# Patient Record
Sex: Male | Born: 1940 | Race: White | Hispanic: No | Marital: Single | Smoking: Former smoker
Health system: Southern US, Community
[De-identification: ages and names within clinical notes are randomized; demographics above are authoritative.]

## PROBLEM LIST (undated history)

## (undated) DIAGNOSIS — N400 Enlarged prostate without lower urinary tract symptoms: Secondary | ICD-10-CM

## (undated) DIAGNOSIS — E785 Hyperlipidemia, unspecified: Secondary | ICD-10-CM

## (undated) DIAGNOSIS — M1990 Unspecified osteoarthritis, unspecified site: Secondary | ICD-10-CM

## (undated) DIAGNOSIS — E1140 Type 2 diabetes mellitus with diabetic neuropathy, unspecified: Secondary | ICD-10-CM

## (undated) DIAGNOSIS — Z85528 Personal history of other malignant neoplasm of kidney: Secondary | ICD-10-CM

## (undated) DIAGNOSIS — N189 Chronic kidney disease, unspecified: Secondary | ICD-10-CM

## (undated) DIAGNOSIS — E119 Type 2 diabetes mellitus without complications: Secondary | ICD-10-CM

## (undated) DIAGNOSIS — I10 Essential (primary) hypertension: Secondary | ICD-10-CM

## (undated) HISTORY — DX: Personal history of other malignant neoplasm of kidney: Z85.528

## (undated) HISTORY — DX: Chronic kidney disease, unspecified: N18.9

## (undated) HISTORY — DX: Type 2 diabetes mellitus without complications: E11.9

## (undated) HISTORY — DX: Hyperlipidemia, unspecified: E78.5

## (undated) HISTORY — PX: CARPAL TUNNEL RELEASE: SHX101

## (undated) HISTORY — DX: Benign prostatic hyperplasia without lower urinary tract symptoms: N40.0

## (undated) HISTORY — DX: Type 2 diabetes mellitus with diabetic neuropathy, unspecified: E11.40

## (undated) HISTORY — PX: NEPHRECTOMY: SHX65

## (undated) HISTORY — PX: CATARACT EXTRACTION: SUR2

## (undated) HISTORY — DX: Unspecified osteoarthritis, unspecified site: M19.90

## (undated) HISTORY — DX: Essential (primary) hypertension: I10

---

## 2015-08-01 ENCOUNTER — Other Ambulatory Visit: Payer: Self-pay

## 2015-08-01 MED ORDER — GLUCOSE BLOOD VI STRP: ORAL_STRIP | Status: DC

## 2015-09-12 LAB — HEMOGLOBIN A1C: HEMOGLOBIN A1C: 8.6 % — AB (ref 4.0–6.0)

## 2015-09-19 ENCOUNTER — Encounter: Payer: Self-pay | Admitting: "Endocrinology

## 2015-09-19 ENCOUNTER — Ambulatory Visit (INDEPENDENT_AMBULATORY_CARE_PROVIDER_SITE_OTHER): Payer: Medicare (Managed Care) | Admitting: "Endocrinology

## 2015-09-19 VITALS — BP 141/62 | HR 82 | Ht 67.0 in | Wt 215.0 lb

## 2015-09-19 DIAGNOSIS — I10 Essential (primary) hypertension: Secondary | ICD-10-CM | POA: Insufficient documentation

## 2015-09-19 DIAGNOSIS — E782 Mixed hyperlipidemia: Secondary | ICD-10-CM | POA: Insufficient documentation

## 2015-09-19 DIAGNOSIS — Z794 Long term (current) use of insulin: Secondary | ICD-10-CM

## 2015-09-19 DIAGNOSIS — E118 Type 2 diabetes mellitus with unspecified complications: Secondary | ICD-10-CM | POA: Diagnosis not present

## 2015-09-19 DIAGNOSIS — IMO0002 Reserved for concepts with insufficient information to code with codable children: Secondary | ICD-10-CM

## 2015-09-19 DIAGNOSIS — E785 Hyperlipidemia, unspecified: Secondary | ICD-10-CM | POA: Diagnosis not present

## 2015-09-19 DIAGNOSIS — E1165 Type 2 diabetes mellitus with hyperglycemia: Secondary | ICD-10-CM | POA: Diagnosis not present

## 2015-09-19 MED ORDER — INSULIN GLARGINE 100 UNIT/ML ~~LOC~~ SOLN: 35.0000 [IU] | Freq: Every day | SUBCUTANEOUS | Status: DC

## 2015-09-19 MED ORDER — INSULIN ASPART 100 UNIT/ML ~~LOC~~ SOLN: 10.0000 [IU] | Freq: Three times a day (TID) | SUBCUTANEOUS | Status: DC

## 2015-09-19 NOTE — Progress Notes (Signed)
Subjective:    Patient ID: Noah Daniel, male    DOB: 07/27/1941, PCP Alinda DeemJANNACH,STEPHEN, MD   Past Medical History  Diagnosis Date  . Hypertension   . Osteoarthritis   . Diabetes mellitus, type II (HCC)   . Diabetic neuropathy (HCC)   . History of kidney cancer   . CKD (chronic kidney disease)   . Hyperlipidemia   . BPH (benign prostatic hyperplasia)    Past Surgical History  Procedure Laterality Date  . Carpal tunnel release    . Cataract extraction    . Nephrectomy     Social History   Social History  . Marital Status: Unknown    Spouse Name: N/A  . Number of Children: N/A  . Years of Education: N/A   Social History Main Topics  . Smoking status: Former Games developermoker  . Smokeless tobacco: None  . Alcohol Use: No  . Drug Use: No  . Sexual Activity: Not Asked   Other Topics Concern  . None   Social History Narrative  . None   Outpatient Encounter Prescriptions as of 09/19/2015  Medication Sig  . amLODipine (NORVASC) 5 MG tablet Take 5 mg by mouth daily.  Marland Kitchen. aspirin 81 MG tablet Take 81 mg by mouth daily.  Marland Kitchen. glucose blood test strip Use as instructed 4 X daily  . losartan-hydrochlorothiazide (HYZAAR) 50-12.5 MG tablet Take 1 tablet by mouth daily.  . pravastatin (PRAVACHOL) 20 MG tablet Take 20 mg by mouth daily.  . Vitamin D, Cholecalciferol, 1000 UNITS CAPS Take by mouth daily.  . [DISCONTINUED] insulin aspart (NOVOLOG FLEXPEN) 100 UNIT/ML FlexPen Inject 10-16 Units into the skin 3 (three) times daily with meals.  . [DISCONTINUED] Insulin Glargine (LANTUS SOLOSTAR) 100 UNIT/ML Solostar Pen Inject 35 Units into the skin at bedtime.  . insulin aspart (NOVOLOG) 100 UNIT/ML injection Inject 10-16 Units into the skin 3 (three) times daily with meals.  . insulin aspart (NOVOLOG) 100 UNIT/ML injection Inject 10-16 Units into the skin 3 (three) times daily with meals.  . insulin glargine (LANTUS) 100 UNIT/ML injection Inject 0.35 mLs (35 Units total) into the skin at  bedtime.  . insulin glargine (LANTUS) 100 UNIT/ML injection Inject 0.35 mLs (35 Units total) into the skin at bedtime.   No facility-administered encounter medications on file as of 09/19/2015.   ALLERGIES: No Known Allergies VACCINATION STATUS:  There is no immunization history on file for this patient.  Diabetes He presents for his follow-up diabetic visit. He has type 2 diabetes mellitus. Onset time: He was diagnosed at approximate age of 38 years. His disease course has been stable. There are no hypoglycemic associated symptoms. Pertinent negatives for hypoglycemia include no confusion, headaches, pallor or seizures. Associated symptoms include fatigue, polydipsia and polyuria. Pertinent negatives for diabetes include no chest pain, no polyphagia and no weakness. There are no hypoglycemic complications. Symptoms are stable. Diabetic complications include peripheral neuropathy. 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 Current diabetic treatment includes intensive insulin program. He is compliant with treatment most of the time. His weight is increasing steadily. He is following a generally unhealthy diet. He has not had a previous visit with a dietitian. He participates in exercise intermittently. His home blood glucose trend is fluctuating minimally. His breakfast blood glucose range is generally 180-200 mg/dl. His lunch blood glucose range is generally 180-200 mg/dl. His dinner blood glucose range is generally  180-200 mg/dl. His overall blood glucose range is 180-200 mg/dl. An ACE inhibitor/angiotensin II receptor blocker is being taken. Eye exam is current.  Hyperlipidemia This is a chronic problem. The current episode started more than 1 year ago. Pertinent negatives include no chest pain, myalgias or shortness of breath. Current  antihyperlipidemic treatment includes statins. Risk factors for coronary artery disease include dyslipidemia, diabetes mellitus, hypertension, male sex and obesity.  Hypertension This is a chronic problem. The current episode started more than 1 year ago. The problem is uncontrolled. Pertinent negatives include no chest pain, headaches, neck pain, palpitations or shortness of breath. Risk factors for coronary artery disease include diabetes mellitus, dyslipidemia, male gender, obesity and smoking/tobacco exposure. Past treatments include angiotensin blockers and diuretics.     Review of Systems  Constitutional: Positive for fatigue. Negative for unexpected weight change.  HENT: Negative for dental problem, mouth sores and trouble swallowing.   Eyes: Negative for visual disturbance.  Respiratory: Negative for cough, choking, chest tightness, shortness of breath and wheezing.   Cardiovascular: Negative for chest pain, palpitations and leg swelling.  Gastrointestinal: Negative for nausea, vomiting, abdominal pain, diarrhea, constipation and abdominal distention.  Endocrine: Positive for polydipsia and polyuria. Negative for polyphagia.  Genitourinary: Negative for dysuria, urgency, hematuria and flank pain.  Musculoskeletal: Negative for myalgias, back pain, gait problem and neck pain.  Skin: Negative for pallor, rash and wound.  Neurological: Negative for seizures, syncope, weakness, numbness and headaches.  Psychiatric/Behavioral: Negative.  Negative for confusion and dysphoric mood.    Objective:    BP 141/62 mmHg  Pulse 82  Ht  (1.702 m)  Wt 215 lb (97.523 kg)  BMI 33.67 kg/m2  SpO2 92%  Wt Readings from Last 3 Encounters:  09/19/15 215 lb (97.523 kg)    Physical Exam  Constitutional: He is oriented to person, place, and time. He appears well-developed and well-nourished. He is cooperative. No distress.  HENT:  Head: Normocephalic and atraumatic.  Eyes: EOM are normal.   Neck: Normal range of motion. Neck supple. No tracheal deviation present. No thyromegaly present.  Cardiovascular: Normal rate, S1 normal, S2 normal and normal heart sounds.  Exam reveals no gallop.   No murmur heard. Pulses:      Dorsalis pedis pulses are 1+ on the right side, and 1+ on the left side.       Posterior tibial pulses are 1+ on the right side, and 1+ on the left side.  Pulmonary/Chest: Breath sounds normal. No respiratory distress. He has no wheezes.  Abdominal: Soft. Bowel sounds are normal. He exhibits no distension. There is no tenderness. There is no guarding and no CVA tenderness.  Musculoskeletal: He exhibits no edema.       Right shoulder: He exhibits no swelling and no deformity.  Neurological: He is alert and oriented to person, place, and time. He has normal strength and normal reflexes. No cranial nerve deficit or sensory deficit. Gait normal.  Skin: Skin is warm and dry. No rash noted. No cyanosis. Nails show no clubbing.  Psychiatric: He has a normal mood and affect. His speech is normal and behavior is normal. Judgment and thought content normal. Cognition and memory are normal.     Assessment & Plan:   1. Uncontrolled type 2 diabetes mellitus with complication, with long-term current use of insulin (HCC) - patient remains at a high risk for more acute and chronic complications of diabetes which include CAD, CVA, CKD, retinopathy, and neuropathy. These are all discussed  in detail with the patient.  Patient came with improved but [place] significantly above target glucose profile, and  recent A1c of 8.6 %.  Result his A1c increased, he has a better consistency of glycemia than before and has much less frequent hypoglycemia. He had 3 mild hypoglycemia episodes in the last month compared with almost daily hypoglycemia prior to his last visit. Glucose logs and insulin administration records pertaining to this visit,  to be scanned into patient's records.  Recent labs  reviewed.   - I have re-counseled the patient on diet management and weight loss  by adopting a carbohydrate restricted / protein rich  Diet.  - Suggestion is made for patient to avoid simple carbohydrates   from their diet including Cakes , Desserts, Ice Cream,  Soda (  diet and regular) , Sweet Tea , Candies,  Chips, Cookies, Artificial Sweeteners,   and "Sugar-free" Products .  This will help patient to have stable blood glucose profile and potentially avoid unintended  Weight gain.  - Patient is advised to stick to a routine mealtimes to eat 3 meals  a day and avoid unnecessary snacks ( to snack only to correct hypoglycemia).  - I have approached patient with the following individualized plan to manage diabetes and patient agrees.  I will increase Lantus to 35 units qhs, increase Novolog to 10 units TIDAC plus correction.   -Patient is warned not to take insulin without proper monitoring per orders.  -Patient is encouraged to call clinic for blood glucose levels less than 70 or above 300 mg /dl.  -Due to his renal hx , he is not a suitable candidate for MTF, SGLT2 inhibitors, and declined DPPV inhibitors.  - Patient specific target  for A1c; LDL, HDL, Triglycerides, and  Waist Circumference were discussed in detail.  2) BP/HTN: Controlled. Continue current medications including ACEI/ARB. 3) Lipids/HPL:  continue statins. 4)  Weight/Diet: CDE consult in progress, exercise, and carbohydrates information provided.  5) Chronic Care/Health Maintenance:  -Patient is on ACEI/ARB and Statin medications and encouraged to continue to follow up with Ophthalmology, Podiatrist at least yearly or according to recommendations, and advised to  stay away from smoking. I have recommended yearly flu vaccine and pneumonia vaccination at least every 5 years; moderate intensity exercise for up to 150 minutes weekly; and  sleep for at least 7 hours a day.  - 25 minutes of time was spent on the care of this  patient , 50% of which was applied for counseling on diabetes complications and their preventions.  - I advised patient to maintain close follow up with Alinda Deem, MD for primary care needs.  Patient is asked to bring meter and  blood glucose logs during their next visit.   Follow up plan: -Return in about 3 months (around 12/18/2015) for diabetes, high blood pressure, high cholesterol, follow up with pre-visit labs, meter, and logs.  Marquis Lunch, MD Phone: [phone]  Fax: [phone]   09/19/2015, 1:40 PM

## 2015-09-19 NOTE — Patient Instructions (Signed)

## 2015-10-27 ENCOUNTER — Other Ambulatory Visit: Payer: Self-pay

## 2015-10-27 MED ORDER — INSULIN GLARGINE 100 UNIT/ML ~~LOC~~ SOLN: 35.0000 [IU] | Freq: Every day | SUBCUTANEOUS | Status: DC

## 2015-10-27 MED ORDER — INSULIN ASPART 100 UNIT/ML ~~LOC~~ SOLN: 10.0000 [IU] | Freq: Three times a day (TID) | SUBCUTANEOUS | Status: DC

## 2015-12-15 ENCOUNTER — Other Ambulatory Visit: Payer: Self-pay | Admitting: "Endocrinology

## 2015-12-15 LAB — BASIC METABOLIC PANEL
BUN: 20 mg/dL (ref 7–25)
CO2: 26 mmol/L (ref 20–31)
Calcium: 10.2 mg/dL (ref 8.6–10.3)
Chloride: 103 mmol/L (ref 98–110)
Creat: 1.23 mg/dL — ABNORMAL HIGH (ref 0.70–1.18)
GLUCOSE: 244 mg/dL — AB (ref 65–99)
POTASSIUM: 4.3 mmol/L (ref 3.5–5.3)
SODIUM: 136 mmol/L (ref 135–146)

## 2015-12-15 LAB — HEMOGLOBIN A1C
Hgb A1c MFr Bld: 8.1 % — ABNORMAL HIGH (ref <5.7)
Mean Plasma Glucose: 186 mg/dL — ABNORMAL HIGH (ref <117)

## 2015-12-15 LAB — TSH: TSH: 1.93 mIU/L (ref 0.40–4.50)

## 2015-12-15 LAB — T4, FREE: Free T4: 0.9 ng/dL (ref 0.8–1.8)

## 2015-12-19 ENCOUNTER — Encounter: Payer: Self-pay | Admitting: "Endocrinology

## 2015-12-19 ENCOUNTER — Ambulatory Visit (INDEPENDENT_AMBULATORY_CARE_PROVIDER_SITE_OTHER): Payer: Medicare (Managed Care) | Admitting: "Endocrinology

## 2015-12-19 VITALS — BP 139/79 | HR 76 | Ht 67.0 in | Wt 212.0 lb

## 2015-12-19 DIAGNOSIS — I10 Essential (primary) hypertension: Secondary | ICD-10-CM | POA: Diagnosis not present

## 2015-12-19 DIAGNOSIS — E118 Type 2 diabetes mellitus with unspecified complications: Secondary | ICD-10-CM

## 2015-12-19 DIAGNOSIS — Z794 Long term (current) use of insulin: Secondary | ICD-10-CM | POA: Diagnosis not present

## 2015-12-19 DIAGNOSIS — IMO0002 Reserved for concepts with insufficient information to code with codable children: Secondary | ICD-10-CM

## 2015-12-19 DIAGNOSIS — E785 Hyperlipidemia, unspecified: Secondary | ICD-10-CM | POA: Diagnosis not present

## 2015-12-19 DIAGNOSIS — E1165 Type 2 diabetes mellitus with hyperglycemia: Secondary | ICD-10-CM

## 2015-12-19 MED ORDER — INSULIN GLARGINE 100 UNIT/ML ~~LOC~~ SOLN: 30.0000 [IU] | Freq: Every day | SUBCUTANEOUS | Status: DC

## 2015-12-19 MED ORDER — INSULIN ASPART 100 UNIT/ML ~~LOC~~ SOLN: 10.0000 [IU] | Freq: Three times a day (TID) | SUBCUTANEOUS | Status: DC

## 2015-12-19 NOTE — Progress Notes (Signed)
Subjective:    Patient ID: Noah Daniel, male    DOB: 03/21/1941, PCP Noah Daniel   Past Medical History  Diagnosis Date  . Hypertension   . Osteoarthritis   . Diabetes mellitus, type II (HCC)   . Diabetic neuropathy (HCC)   . History of kidney cancer   . CKD (chronic kidney disease)   . Hyperlipidemia   . BPH (benign prostatic hyperplasia)    Past Surgical History  Procedure Laterality Date  . Carpal tunnel release    . Cataract extraction    . Nephrectomy     Social History   Social History  . Marital Status: Unknown    Spouse Name: N/A  . Number of Children: N/A  . Years of Education: N/A   Social History Main Topics  . Smoking status: Former Games developermoker  . Smokeless tobacco: None  . Alcohol Use: No  . Drug Use: No  . Sexual Activity: Not Asked   Other Topics Concern  . None   Social History Narrative   Outpatient Encounter Prescriptions as of 12/19/2015  Medication Sig  . amLODipine (NORVASC) 5 MG tablet Take 5 mg by mouth daily.  Marland Kitchen. aspirin 81 MG tablet Take 81 mg by mouth daily.  . insulin aspart (NOVOLOG) 100 UNIT/ML injection Inject 10-16 Units into the skin 3 (three) times daily with meals.  . insulin glargine (LANTUS) 100 UNIT/ML injection Inject 0.3 mLs (30 Units total) into the skin at bedtime.  Marland Kitchen. losartan-hydrochlorothiazide (HYZAAR) 50-12.5 MG tablet Take 1 tablet by mouth daily.  . pravastatin (PRAVACHOL) 20 MG tablet Take 20 mg by mouth daily.  . Vitamin D, Cholecalciferol, 1000 UNITS CAPS Take by mouth daily.  . [DISCONTINUED] insulin aspart (NOVOLOG) 100 UNIT/ML injection Inject 10-16 Units into the skin 3 (three) times daily with meals.  . [DISCONTINUED] insulin glargine (LANTUS) 100 UNIT/ML injection Inject 0.35 mLs (35 Units total) into the skin at bedtime.  Marland Kitchen. glucose blood test strip Use as instructed 4 X daily   No facility-administered encounter medications on file as of 12/19/2015.   ALLERGIES: No Known Allergies VACCINATION  STATUS:  There is no immunization history on file for this patient.  Diabetes He presents for his follow-up diabetic visit. He has type 2 diabetes mellitus. Onset time: He was diagnosed at approximate age of 38 years. His disease course has been improving. There are no hypoglycemic associated symptoms. Pertinent negatives for hypoglycemia include no confusion, headaches, pallor or seizures. Associated symptoms include fatigue, polydipsia and polyuria. Pertinent negatives for diabetes include no chest pain, no polyphagia and no weakness. There are no hypoglycemic complications. Symptoms are improving. Diabetic complications include peripheral neuropathy. 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 Current diabetic treatment includes intensive insulin program. He is compliant with treatment most of the time. His weight is increasing steadily. He is following a generally unhealthy diet. He has not had a previous visit with a dietitian. He participates in exercise intermittently. His home blood glucose trend is fluctuating minimally (He Noah Daniel gets some random hypoglycemia but at much less frequency than before.). His breakfast blood glucose range is generally 140-180 mg/dl. His lunch blood glucose range is generally 180-200 mg/dl. His dinner blood glucose range is generally 180-200 mg/dl. His overall blood glucose range is 180-200 mg/dl. An ACE inhibitor/angiotensin II receptor blocker is being taken. Eye exam is current.  Hyperlipidemia This is a chronic  problem. The current episode started more than 1 year ago. Pertinent negatives include no chest pain, myalgias or shortness of breath. Current antihyperlipidemic treatment includes statins. Risk factors for coronary artery disease include dyslipidemia, diabetes mellitus, hypertension, male sex and obesity.   Hypertension This is a chronic problem. The current episode started more than 1 year ago. The problem is uncontrolled. Pertinent negatives include no chest pain, headaches, neck pain, palpitations or shortness of breath. Risk factors for coronary artery disease include diabetes mellitus, dyslipidemia, male gender, obesity and smoking/tobacco exposure. Past treatments include angiotensin blockers and diuretics.     Review of Systems  Constitutional: Positive for fatigue. Negative for unexpected weight change.  HENT: Negative for dental problem, mouth sores and trouble swallowing.   Eyes: Negative for visual disturbance.  Respiratory: Negative for cough, choking, chest tightness, shortness of breath and wheezing.   Cardiovascular: Negative for chest pain, palpitations and leg swelling.  Gastrointestinal: Negative for nausea, vomiting, abdominal pain, diarrhea, constipation and abdominal distention.  Endocrine: Positive for polydipsia and polyuria. Negative for polyphagia.  Genitourinary: Negative for dysuria, urgency, hematuria and flank pain.  Musculoskeletal: Negative for myalgias, back pain, gait problem and neck pain.  Skin: Negative for pallor, rash and wound.  Neurological: Negative for seizures, syncope, weakness, numbness and headaches.  Psychiatric/Behavioral: Negative.  Negative for confusion and dysphoric mood.    Objective:    BP 139/79 mmHg  Pulse 76  Ht  (1.702 m)  Wt 212 lb (96.163 kg)  BMI 33.20 kg/m2  SpO2 97%  Wt Readings from Last 3 Encounters:  12/19/15 212 lb (96.163 kg)  09/19/15 215 lb (97.523 kg)    Physical Exam  Constitutional: He is oriented to person, place, and time. He appears well-developed and well-nourished. He is cooperative. No distress.  HENT:  Head: Normocephalic and atraumatic.  Eyes: EOM are normal.  Neck: Normal range of motion. Neck supple. No tracheal deviation present. No thyromegaly present.  Cardiovascular: Normal rate, S1  normal, S2 normal and normal heart sounds.  Exam reveals no gallop.   No murmur heard. Pulses:      Dorsalis pedis pulses are 1+ on the right side, and 1+ on the left side.       Posterior tibial pulses are 1+ on the right side, and 1+ on the left side.  Pulmonary/Chest: Breath sounds normal. No respiratory distress. He has no wheezes.  Abdominal: Soft. Bowel sounds are normal. He exhibits no distension. There is no tenderness. There is no guarding and no CVA tenderness.  Musculoskeletal: He exhibits no edema.       Right shoulder: He exhibits no swelling and no deformity.  Neurological: He is alert and oriented to person, place, and time. He has normal strength and normal reflexes. No cranial nerve deficit or sensory deficit. Gait normal.  Skin: Skin is warm and dry. No rash noted. No cyanosis. Nails show no clubbing.  Psychiatric: He has a normal mood and affect. His speech is normal and behavior is normal. Judgment and thought content normal. Cognition and memory are normal.     Assessment & Plan:   1. Uncontrolled type 2 diabetes mellitus with complication, with long-term current use of insulin (HCC) - patient remains at a high risk for more acute and chronic complications of diabetes which include CAD, CVA, CKD, retinopathy, and neuropathy. These are all discussed in detail with the patient.  Patient came with improved but [place] significantly above target glucose profile, and  recent A1c of 8%  improving from 8.6 %.  Result his A1c increased, he has a better consistency of glycemia than before and has much less frequent hypoglycemia. He had 3 mild hypoglycemia episodes in the last month compared with almost daily hypoglycemia prior to his last visit. Glucose logs and insulin administration records pertaining to this visit,  to be scanned into patient's records.  Recent labs reviewed.   - I have re-counseled the patient on diet management and weight loss  by adopting a carbohydrate  restricted / protein rich  Diet.  - Suggestion is made for patient to avoid simple carbohydrates   from their diet including Cakes , Desserts, Ice Cream,  Soda (  diet and regular) , Sweet Tea , Candies,  Chips, Cookies, Artificial Sweeteners,   and "Sugar-free" Products .  This will help patient to have stable blood glucose profile and potentially avoid unintended  Weight gain.  - Patient is advised to stick to a routine mealtimes to eat 3 meals  a day and avoid unnecessary snacks ( to snack only to correct hypoglycemia).  - I have approached patient with the following individualized plan to manage diabetes and patient agrees.  -I will decrease Lantus to 30 units qhs, continue Novolog 10 units TIDAC plus correction, when pre-meal blood glucoses above 90 mg/dL.  -I allowed him to use 5 units of NovoLog when pre-meal blood glucose is between 70 and 90 mg/dL . -Patient is warned not to take insulin without proper monitoring per orders. -I gave him information and brochures on DEXCOM.  -Patient is encouraged to call clinic for blood glucose levels less than 70 or above 300 mg /dl.  -Due to his renal hx , he is not a suitable candidate for MTF, SGLT2 inhibitors, and declined DPPV inhibitors.  - Patient specific target  for A1c; LDL, HDL, Triglycerides, and  Waist Circumference were discussed in detail.  2) BP/HTN: Controlled. Continue current medications including ACEI/ARB. 3) Lipids/HPL:  continue statins. 4)  Weight/Diet: CDE consult in progress, exercise, and carbohydrates information provided.  5) Chronic Care/Health Maintenance:  -Patient is on ACEI/ARB and Statin medications and encouraged to continue to follow up with Ophthalmology, Podiatrist at least yearly or according to recommendations, and advised to  stay away from smoking. I have recommended yearly flu vaccine and pneumonia vaccination at least every 5 years; moderate intensity exercise for up to 150 minutes weekly; and  sleep for  at least 7 hours a day.  - 25 minutes of time was spent on the care of this patient , 50% of which was applied for counseling on diabetes complications and their preventions.  - I advised patient to maintain close follow up with Noah Deem, Daniel for primary care needs.  Patient is asked to bring meter and  blood glucose logs during their next visit.   Follow up plan: -Return in about 3 months (around 03/20/2016) for diabetes, high blood pressure, high cholesterol, follow up with pre-visit labs, meter, and logs.  Marquis Lunch, Daniel Phone: [phone]  Fax: [phone]   12/19/2015, 1:19 PM

## 2015-12-19 NOTE — Patient Instructions (Signed)

## 2016-03-13 ENCOUNTER — Other Ambulatory Visit: Payer: Self-pay | Admitting: "Endocrinology

## 2016-03-13 LAB — HEMOGLOBIN A1C
Hgb A1c MFr Bld: 8 % — ABNORMAL HIGH (ref <5.7)
Mean Plasma Glucose: 183 mg/dL

## 2016-03-13 LAB — BASIC METABOLIC PANEL
BUN: 20 mg/dL (ref 7–25)
CALCIUM: 10 mg/dL (ref 8.6–10.3)
CO2: 29 mmol/L (ref 20–31)
CREATININE: 1.17 mg/dL (ref 0.70–1.18)
Chloride: 99 mmol/L (ref 98–110)
GLUCOSE: 232 mg/dL — AB (ref 65–99)
Potassium: 4.6 mmol/L (ref 3.5–5.3)
Sodium: 135 mmol/L (ref 135–146)

## 2016-03-19 ENCOUNTER — Ambulatory Visit (INDEPENDENT_AMBULATORY_CARE_PROVIDER_SITE_OTHER): Payer: Medicare (Managed Care) | Admitting: "Endocrinology

## 2016-03-19 ENCOUNTER — Encounter: Payer: Self-pay | Admitting: "Endocrinology

## 2016-03-19 VITALS — BP 139/72 | HR 72 | Ht 67.0 in | Wt 210.0 lb

## 2016-03-19 DIAGNOSIS — E1165 Type 2 diabetes mellitus with hyperglycemia: Secondary | ICD-10-CM | POA: Diagnosis not present

## 2016-03-19 DIAGNOSIS — I10 Essential (primary) hypertension: Secondary | ICD-10-CM | POA: Diagnosis not present

## 2016-03-19 DIAGNOSIS — E118 Type 2 diabetes mellitus with unspecified complications: Secondary | ICD-10-CM

## 2016-03-19 DIAGNOSIS — Z794 Long term (current) use of insulin: Secondary | ICD-10-CM | POA: Diagnosis not present

## 2016-03-19 DIAGNOSIS — IMO0002 Reserved for concepts with insufficient information to code with codable children: Secondary | ICD-10-CM

## 2016-03-19 DIAGNOSIS — E785 Hyperlipidemia, unspecified: Secondary | ICD-10-CM | POA: Diagnosis not present

## 2016-03-19 NOTE — Patient Instructions (Signed)

## 2016-03-19 NOTE — Progress Notes (Signed)
Subjective:    Patient ID: Noah Daniel, male    DOB: 10/24/1940, PCP Alinda DeemJANNACH,STEPHEN, MD   Past Medical History  Diagnosis Date  . Hypertension   . Osteoarthritis   . Diabetes mellitus, type II (HCC)   . Diabetic neuropathy (HCC)   . History of kidney cancer   . CKD (chronic kidney disease)   . Hyperlipidemia   . BPH (benign prostatic hyperplasia)    Past Surgical History  Procedure Laterality Date  . Carpal tunnel release    . Cataract extraction    . Nephrectomy     Social History   Social History  . Marital Status: Unknown    Spouse Name: N/A  . Number of Children: N/A  . Years of Education: N/A   Social History Main Topics  . Smoking status: Former Games developermoker  . Smokeless tobacco: None  . Alcohol Use: No  . Drug Use: No  . Sexual Activity: Not Asked   Other Topics Concern  . None   Social History Narrative   Outpatient Encounter Prescriptions as of 03/19/2016  Medication Sig  . amLODipine (NORVASC) 5 MG tablet Take 5 mg by mouth daily.  Marland Kitchen. aspirin 81 MG tablet Take 81 mg by mouth daily.  Marland Kitchen. glucose blood test strip Use as instructed 4 X daily  . insulin aspart (NOVOLOG) 100 UNIT/ML injection Inject 10-16 Units into the skin 3 (three) times daily with meals.  . insulin glargine (LANTUS) 100 UNIT/ML injection Inject 0.3 mLs (30 Units total) into the skin at bedtime.  Marland Kitchen. losartan-hydrochlorothiazide (HYZAAR) 50-12.5 MG tablet Take 1 tablet by mouth daily.  . pravastatin (PRAVACHOL) 20 MG tablet Take 20 mg by mouth daily.  . Vitamin D, Cholecalciferol, 1000 UNITS CAPS Take by mouth daily.   No facility-administered encounter medications on file as of 03/19/2016.   ALLERGIES: No Known Allergies VACCINATION STATUS:  There is no immunization history on file for this patient.  Diabetes He presents for his follow-up diabetic visit. He has type 2 diabetes mellitus. Onset time: He was diagnosed at approximate age of 38 years. His disease course has been improving.  There are no hypoglycemic associated symptoms. Pertinent negatives for hypoglycemia include no confusion, headaches, pallor or seizures. Associated symptoms include fatigue. Pertinent negatives for diabetes include no chest pain, no polydipsia, no polyphagia, no polyuria and no weakness. There are no hypoglycemic complications. Symptoms are improving. Diabetic complications include peripheral neuropathy. 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 Current diabetic treatment includes intensive insulin program. He is compliant with treatment most of the time. His weight is increasing steadily. He is following a generally unhealthy diet. He has not had a previous visit with a dietitian. He participates in exercise intermittently. His home blood glucose trend is fluctuating minimally (He Vey gets some random hypoglycemia but at much less frequency than before.). His breakfast blood glucose range is generally 140-180 mg/dl. His lunch blood glucose range is generally 180-200 mg/dl. His dinner blood glucose range is generally 180-200 mg/dl. His overall blood glucose range is 180-200 mg/dl. An ACE inhibitor/angiotensin II receptor blocker is being taken. Eye exam is current.  Hyperlipidemia This is a chronic problem. The current episode started more than 1 year ago. Pertinent negatives include no chest pain, myalgias or shortness of breath. Current antihyperlipidemic treatment includes statins. Risk factors for coronary artery disease include dyslipidemia, diabetes mellitus, hypertension, male sex and  obesity.  Hypertension This is a chronic problem. The current episode started more than 1 year ago. The problem is uncontrolled. Pertinent negatives include no chest pain, headaches, neck pain, palpitations or shortness of breath. Risk factors for coronary artery  disease include diabetes mellitus, dyslipidemia, male gender, obesity and smoking/tobacco exposure. Past treatments include angiotensin blockers and diuretics.     Review of Systems  Constitutional: Positive for fatigue. Negative for unexpected weight change.  HENT: Negative for dental problem, mouth sores and trouble swallowing.   Eyes: Negative for visual disturbance.  Respiratory: Negative for cough, choking, chest tightness, shortness of breath and wheezing.   Cardiovascular: Negative for chest pain, palpitations and leg swelling.  Gastrointestinal: Negative for nausea, vomiting, abdominal pain, diarrhea, constipation and abdominal distention.  Endocrine: Negative for polydipsia, polyphagia and polyuria.  Genitourinary: Negative for dysuria, urgency, hematuria and flank pain.  Musculoskeletal: Negative for myalgias, back pain, gait problem and neck pain.  Skin: Negative for pallor, rash and wound.  Neurological: Negative for seizures, syncope, weakness, numbness and headaches.  Psychiatric/Behavioral: Negative.  Negative for confusion and dysphoric mood.    Objective:    BP 139/72 mmHg  Pulse 72  Ht  (1.702 m)  Wt 210 lb (95.255 kg)  BMI 32.88 kg/m2  SpO2 96%  Wt Readings from Last 3 Encounters:  03/19/16 210 lb (95.255 kg)  12/19/15 212 lb (96.163 kg)  09/19/15 215 lb (97.523 kg)    Physical Exam  Constitutional: He is oriented to person, place, and time. He appears well-developed and well-nourished. He is cooperative. No distress.  HENT:  Head: Normocephalic and atraumatic.  Eyes: EOM are normal.  Neck: Normal range of motion. Neck supple. No tracheal deviation present. No thyromegaly present.  Cardiovascular: Normal rate, S1 normal, S2 normal and normal heart sounds.  Exam reveals no gallop.   No murmur heard. Pulses:      Dorsalis pedis pulses are 1+ on the right side, and 1+ on the left side.       Posterior tibial pulses are 1+ on the right side, and 1+ on the  left side.  Pulmonary/Chest: Breath sounds normal. No respiratory distress. He has no wheezes.  Abdominal: Soft. Bowel sounds are normal. He exhibits no distension. There is no tenderness. There is no guarding and no CVA tenderness.  Musculoskeletal: He exhibits no edema.       Right shoulder: He exhibits no swelling and no deformity.  Neurological: He is alert and oriented to person, place, and time. He has normal strength and normal reflexes. No cranial nerve deficit or sensory deficit. Gait normal.  Skin: Skin is warm and dry. No rash noted. No cyanosis. Nails show no clubbing.  Psychiatric: He has a normal mood and affect. His speech is normal and behavior is normal. Judgment and thought content normal. Cognition and memory are normal.     Assessment & Plan:   1. Uncontrolled type 2 diabetes mellitus with complication, with long-term current use of insulin (HCC) - patient remains at a high risk for more acute and chronic complications of diabetes which include CAD, CVA, CKD, retinopathy, and neuropathy. These are all discussed in detail with the patient.  Patient came with improved but [place] above target glucose profile, and  recent A1c of 8% improving from 8.6 %.   Although his recent A1c remains high at 8%, he has a better consistency of glycemia than before and has much less frequent hypoglycemia. -   He insists that he has  to control diabetes to a1c of 6%. I discussed with him safe target for his a1c is 7.5-8%. He is reluctant.   - Glucose logs and insulin administration records pertaining to this visit,  to be scanned into patient's records.  Recent labs reviewed.   - I have re-counseled the patient on diet management and weight loss  by adopting a carbohydrate restricted / protein rich  Diet.  - Suggestion is made for patient to avoid simple carbohydrates   from their diet including Cakes , Desserts, Ice Cream,  Soda (  diet and regular) , Sweet Tea , Candies,  Chips, Cookies,  Artificial Sweeteners,   and "Sugar-free" Products .  This will help patient to have stable blood glucose profile and potentially avoid unintended  Weight gain.  - Patient is advised to stick to a routine mealtimes to eat 3 meals  a day and avoid unnecessary snacks ( to snack only to correct hypoglycemia).  - I have approached patient with the following individualized plan to manage diabetes and patient agrees.  -I will continue Lantus  30 units qhs, continue Novolog 10 units TIDAC plus correction, when pre-meal blood glucoses above 90 mg/dL.  -I have advised him to stop the  5 units of NovoLog when pre-meal blood glucose is between 70 and 90 mg/dL . -Patient is warned not to take insulin without proper monitoring per orders. - The DEXCOM would have helped him , but does not want to wear a sensor.  -Patient is encouraged to call clinic for blood glucose levels less than 70 or above 300 mg /dl.  -Due to his renal hx , he is not a suitable candidate for MTF, SGLT2 inhibitors, and declined DPPV inhibitors.  - Patient specific target  for A1c; LDL, HDL, Triglycerides, and  Waist Circumference were discussed in detail.  2) BP/HTN: Controlled. Continue current medications including ACEI/ARB. 3) Lipids/HPL:  continue statins. 4)  Weight/Diet: CDE consult in progress, exercise, and carbohydrates information provided.  5) Chronic Care/Health Maintenance:  -Patient is on ACEI/ARB and Statin medications and encouraged to continue to follow up with Ophthalmology, Podiatrist at least yearly or according to recommendations, and advised to  stay away from smoking. I have recommended yearly flu vaccine and pneumonia vaccination at least every 5 years; moderate intensity exercise for up to 150 minutes weekly; and  sleep for at least 7 hours a day.  - 25 minutes of time was spent on the care of this patient , 50% of which was applied for counseling on diabetes complications and their preventions.  - I  advised patient to maintain close follow up with Alinda Deem, MD for primary care needs.  Patient is asked to bring meter and  blood glucose logs during their next visit.   Follow up plan: -Return in about 3 months (around 06/19/2016) for diabetes, high blood pressure, high cholesterol, follow up with pre-visit labs, meter, and logs.  Marquis Lunch, MD Phone: [phone]  Fax: [phone]   03/19/2016, 8:56 AM

## 2016-06-20 ENCOUNTER — Other Ambulatory Visit: Payer: Self-pay | Admitting: "Endocrinology

## 2016-06-20 LAB — COMPLETE METABOLIC PANEL WITH GFR
ALT: 16 U/L (ref 9–46)
AST: 16 U/L (ref 10–35)
Albumin: 4 g/dL (ref 3.6–5.1)
Alkaline Phosphatase: 84 U/L (ref 40–115)
BILIRUBIN TOTAL: 1.5 mg/dL — AB (ref 0.2–1.2)
BUN: 19 mg/dL (ref 7–25)
CALCIUM: 9.8 mg/dL (ref 8.6–10.3)
CO2: 25 mmol/L (ref 20–31)
CREATININE: 1.22 mg/dL — AB (ref 0.70–1.18)
Chloride: 101 mmol/L (ref 98–110)
GFR, EST AFRICAN AMERICAN: 66 mL/min (ref >=60)
GFR, Est Non African American: 57 mL/min — ABNORMAL LOW (ref >=60)
Glucose, Bld: 359 mg/dL — ABNORMAL HIGH (ref 65–99)
Potassium: 4.2 mmol/L (ref 3.5–5.3)
Sodium: 132 mmol/L — ABNORMAL LOW (ref 135–146)
TOTAL PROTEIN: 5.9 g/dL — AB (ref 6.1–8.1)

## 2016-06-21 LAB — HEMOGLOBIN A1C
Hgb A1c MFr Bld: 7.5 % — ABNORMAL HIGH (ref <5.7)
Mean Plasma Glucose: 169 mg/dL

## 2016-06-25 ENCOUNTER — Ambulatory Visit (INDEPENDENT_AMBULATORY_CARE_PROVIDER_SITE_OTHER): Payer: Medicare (Managed Care) | Admitting: "Endocrinology

## 2016-06-25 ENCOUNTER — Encounter: Payer: Self-pay | Admitting: "Endocrinology

## 2016-06-25 VITALS — BP 135/66 | HR 86 | Ht 71.0 in | Wt 213.8 lb

## 2016-06-25 DIAGNOSIS — E785 Hyperlipidemia, unspecified: Secondary | ICD-10-CM | POA: Diagnosis not present

## 2016-06-25 DIAGNOSIS — I10 Essential (primary) hypertension: Secondary | ICD-10-CM | POA: Diagnosis not present

## 2016-06-25 DIAGNOSIS — IMO0002 Reserved for concepts with insufficient information to code with codable children: Secondary | ICD-10-CM

## 2016-06-25 DIAGNOSIS — Z794 Long term (current) use of insulin: Secondary | ICD-10-CM

## 2016-06-25 DIAGNOSIS — E1165 Type 2 diabetes mellitus with hyperglycemia: Secondary | ICD-10-CM

## 2016-06-25 DIAGNOSIS — E118 Type 2 diabetes mellitus with unspecified complications: Secondary | ICD-10-CM | POA: Diagnosis not present

## 2016-06-25 NOTE — Progress Notes (Signed)
Subjective:    Patient ID: Noah Daniel, male    DOB: 10/15/1941, PCP Alinda Deem, MD   Past Medical History:  Diagnosis Date  . BPH (benign prostatic hyperplasia)   . CKD (chronic kidney disease)   . Diabetes mellitus, type II (HCC)   . Diabetic neuropathy (HCC)   . History of kidney cancer   . Hyperlipidemia   . Hypertension   . Osteoarthritis    Past Surgical History:  Procedure Laterality Date  . CARPAL TUNNEL RELEASE    . CATARACT EXTRACTION    . NEPHRECTOMY     Social History   Social History  . Marital status: Unknown    Spouse name: N/A  . Number of children: N/A  . Years of education: N/A   Social History Main Topics  . Smoking status: Former Games developer  . Smokeless tobacco: None  . Alcohol use No  . Drug use: No  . Sexual activity: Not Asked   Other Topics Concern  . None   Social History Narrative  . None   Outpatient Encounter Prescriptions as of 06/25/2016  Medication Sig  . amLODipine (NORVASC) 5 MG tablet Take 5 mg by mouth daily.  Marland Kitchen aspirin 81 MG tablet Take 81 mg by mouth daily.  Marland Kitchen glucose blood test strip Use as instructed 4 X daily  . insulin aspart (NOVOLOG) 100 UNIT/ML injection Inject 10-16 Units into the skin 3 (three) times daily with meals.  . insulin glargine (LANTUS) 100 UNIT/ML injection Inject 0.3 mLs (30 Units total) into the skin at bedtime.  Marland Kitchen losartan-hydrochlorothiazide (HYZAAR) 50-12.5 MG tablet Take 1 tablet by mouth daily.  . pravastatin (PRAVACHOL) 20 MG tablet Take 20 mg by mouth daily.  . Vitamin D, Cholecalciferol, 1000 UNITS CAPS Take by mouth daily.   No facility-administered encounter medications on file as of 06/25/2016.    ALLERGIES: No Known Allergies VACCINATION STATUS:  There is no immunization history on file for this patient.  Hyperlipidemia  This is a chronic problem. The current episode started more than 1 year ago. Pertinent negatives include no chest pain, myalgias or shortness of breath. Current  antihyperlipidemic treatment includes statins. Risk factors for coronary artery disease include dyslipidemia, diabetes mellitus, hypertension, male sex and obesity.  Hypertension  This is a chronic problem. The current episode started more than 1 year ago. The problem is uncontrolled. Pertinent negatives include no chest pain, headaches, neck pain, palpitations or shortness of breath. Risk factors for coronary artery disease include diabetes mellitus, dyslipidemia, male gender, obesity and smoking/tobacco exposure. Past treatments include angiotensin blockers and diuretics.  Diabetes  He presents for his follow-up diabetic visit. He has type 2 diabetes mellitus. Onset time: He was diagnosed at approximate age of 38 years. His disease course has been improving. There are no hypoglycemic associated symptoms. Pertinent negatives for hypoglycemia include no confusion, headaches, pallor or seizures. Associated symptoms include fatigue. Pertinent negatives for diabetes include no chest pain, no polydipsia, no polyphagia, no polyuria and no weakness. There are no hypoglycemic complications. Symptoms are improving. Diabetic complications include peripheral neuropathy. 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 Current diabetic treatment includes intensive insulin program. He is compliant with treatment most of the time. His weight is increasing steadily. He is following a generally unhealthy diet. He has not had a previous visit with a dietitian. He participates in exercise intermittently. His home blood  glucose trend is fluctuating minimally (He Samons gets some random hypoglycemia but at much less frequency than before.). His breakfast blood glucose range is generally 140-180 mg/dl. His lunch blood glucose range is generally 180-200 mg/dl. His dinner blood glucose  range is generally 180-200 mg/dl. His overall blood glucose range is 180-200 mg/dl. An ACE inhibitor/angiotensin II receptor blocker is being taken. Eye exam is current.     Review of Systems  Constitutional: Positive for fatigue. Negative for unexpected weight change.  HENT: Negative for dental problem, mouth sores and trouble swallowing.   Eyes: Negative for visual disturbance.  Respiratory: Negative for cough, choking, chest tightness, shortness of breath and wheezing.   Cardiovascular: Negative for chest pain, palpitations and leg swelling.  Gastrointestinal: Negative for abdominal distention, abdominal pain, constipation, diarrhea, nausea and vomiting.  Endocrine: Negative for polydipsia, polyphagia and polyuria.  Genitourinary: Negative for dysuria, flank pain, hematuria and urgency.  Musculoskeletal: Negative for back pain, gait problem, myalgias and neck pain.  Skin: Negative for pallor, rash and wound.  Neurological: Negative for seizures, syncope, weakness, numbness and headaches.  Psychiatric/Behavioral: Negative.  Negative for confusion and dysphoric mood.    Objective:    BP 135/66   Pulse 86   Ht 5\' 11"  (1.803 m)   Wt 213 lb 12.8 oz (97 kg)   BMI 29.82 kg/m   Wt Readings from Last 3 Encounters:  06/25/16 213 lb 12.8 oz (97 kg)  03/19/16 210 lb (95.3 kg)  12/19/15 212 lb (96.2 kg)    Physical Exam  Constitutional: He is oriented to person, place, and time. He appears well-developed and well-nourished. He is cooperative. No distress.  HENT:  Head: Normocephalic and atraumatic.  Eyes: EOM are normal.  Neck: Normal range of motion. Neck supple. No tracheal deviation present. No thyromegaly present.  Cardiovascular: Normal rate, S1 normal, S2 normal and normal heart sounds.  Exam reveals no gallop.   No murmur heard. Pulses:      Dorsalis pedis pulses are 1+ on the right side, and 1+ on the left side.       Posterior tibial pulses are 1+ on the right side, and 1+ on  the left side.  Pulmonary/Chest: Breath sounds normal. No respiratory distress. He has no wheezes.  Abdominal: Soft. Bowel sounds are normal. He exhibits no distension. There is no tenderness. There is no guarding and no CVA tenderness.  Musculoskeletal: He exhibits no edema.       Right shoulder: He exhibits no swelling and no deformity.  Neurological: He is alert and oriented to person, place, and time. He has normal strength and normal reflexes. No cranial nerve deficit or sensory deficit. Gait normal.  Skin: Skin is warm and dry. No rash noted. No cyanosis. Nails show no clubbing.  Psychiatric: He has a normal mood and affect. His speech is normal and behavior is normal. Judgment and thought content normal. Cognition and memory are normal.     Assessment & Plan:   1. Uncontrolled type 2 diabetes mellitus with complication, with long-term current use of insulin (HCC) - patient remains at a high risk for more acute and chronic complications of diabetes which include CAD, CVA, CKD, retinopathy, and neuropathy. These are all discussed in detail with the patient.  Patient came with improved  glucose profile, and  recent A1c of 7.5 % improving from 8.6 %.   - he has a better consistency of glycemia than before and has much less frequent hypoglycemia. -   He  insists that he has to control diabetes to a1c of 6%. I discussed with him safe target for his a1c is 7.5-8%. He is reluctant.   - Glucose logs and insulin administration records pertaining to this visit,  to be scanned into patient's records.  Recent labs reviewed.   - I have re-counseled the patient on diet management and weight loss  by adopting a carbohydrate restricted / protein rich  Diet.  - Suggestion is made for patient to avoid simple carbohydrates   from their diet including Cakes , Desserts, Ice Cream,  Soda (  diet and regular) , Sweet Tea , Candies,  Chips, Cookies, Artificial Sweeteners,   and "Sugar-free" Products .  This  will help patient to have stable blood glucose profile and potentially avoid unintended  Weight gain.  - Patient is advised to stick to a routine mealtimes to eat 3 meals  a day and avoid unnecessary snacks ( to snack only to correct hypoglycemia).  - I have approached patient with the following individualized plan to manage diabetes and patient agrees.  -I will continue Lantus  30 units qhs, continue Novolog 10 units TIDAC plus correction, when pre-meal blood glucoses above 90 mg/dL.  -I have advised him to stop the  5 units of NovoLog when pre-meal blood glucose is between 70 and 90 mg/dL . -Patient is warned not to take insulin without proper monitoring per orders. - The DEXCOM would have helped him , but does not want to wear a sensor.  -Patient is encouraged to call clinic for blood glucose levels less than 70 or above 300 mg /dl.  -Due to his renal hx , he is not a suitable candidate for MTF, SGLT2 inhibitors, and declined DPPV inhibitors.  - Patient specific target  for A1c; LDL, HDL, Triglycerides, and  Waist Circumference were discussed in detail.  2) BP/HTN: Controlled. Continue current medications including ACEI/ARB. 3) Lipids/HPL:  continue statins. 4)  Weight/Diet: CDE consult in progress, exercise, and carbohydrates information provided.  5) Chronic Care/Health Maintenance:  -Patient is on ACEI/ARB and Statin medications and encouraged to continue to follow up with Ophthalmology, Podiatrist at least yearly or according to recommendations, and advised to  stay away from smoking. I have recommended yearly flu vaccine and pneumonia vaccination at least every 5 years; moderate intensity exercise for up to 150 minutes weekly; and  sleep for at least 7 hours a day.  - 25 minutes of time was spent on the care of this patient , 50% of which was applied for counseling on diabetes complications and their preventions.  - I advised patient to maintain close follow up with Alinda Deem, MD for primary care needs.  Patient is asked to bring meter and  blood glucose logs during their next visit.   Follow up plan: -Return in about 3 months (around 09/24/2016) for follow up with pre-visit labs, meter, and logs.  Marquis Lunch, MD Phone: [phone]  Fax: [phone]   06/25/2016, 11:15 AM

## 2016-06-25 NOTE — Patient Instructions (Signed)

## 2016-09-11 ENCOUNTER — Other Ambulatory Visit: Payer: Self-pay

## 2016-09-11 ENCOUNTER — Other Ambulatory Visit: Payer: Self-pay | Admitting: "Endocrinology

## 2016-09-11 MED ORDER — INSULIN ASPART 100 UNIT/ML ~~LOC~~ SOLN: SUBCUTANEOUS | 0 refills | Status: DC

## 2016-09-11 MED ORDER — INSULIN GLARGINE 100 UNIT/ML ~~LOC~~ SOLN: SUBCUTANEOUS | 0 refills | Status: DC

## 2016-09-13 ENCOUNTER — Other Ambulatory Visit: Payer: Self-pay | Admitting: "Endocrinology

## 2016-09-28 ENCOUNTER — Other Ambulatory Visit: Payer: Self-pay | Admitting: "Endocrinology

## 2016-09-28 LAB — LIPID PANEL
CHOLESTEROL: 132 mg/dL (ref <200)
HDL: 42 mg/dL (ref >40)
LDL CALC: 74 mg/dL (ref <100)
Total CHOL/HDL Ratio: 3.1 Ratio (ref <5.0)
Triglycerides: 80 mg/dL (ref <150)
VLDL: 16 mg/dL (ref <30)

## 2016-09-28 LAB — COMPLETE METABOLIC PANEL WITH GFR
ALT: 16 U/L (ref 9–46)
AST: 18 U/L (ref 10–35)
Albumin: 3.8 g/dL (ref 3.6–5.1)
Alkaline Phosphatase: 85 U/L (ref 40–115)
BUN: 16 mg/dL (ref 7–25)
CALCIUM: 9.4 mg/dL (ref 8.6–10.3)
CHLORIDE: 103 mmol/L (ref 98–110)
CO2: 28 mmol/L (ref 20–31)
Creat: 1.09 mg/dL (ref 0.70–1.18)
GFR, EST AFRICAN AMERICAN: 75 mL/min (ref >=60)
GFR, Est Non African American: 65 mL/min (ref >=60)
Glucose, Bld: 192 mg/dL — ABNORMAL HIGH (ref 65–99)
POTASSIUM: 4.1 mmol/L (ref 3.5–5.3)
Sodium: 139 mmol/L (ref 135–146)
Total Bilirubin: 1.4 mg/dL — ABNORMAL HIGH (ref 0.2–1.2)
Total Protein: 5.6 g/dL — ABNORMAL LOW (ref 6.1–8.1)

## 2016-09-28 LAB — HEMOGLOBIN A1C
Hgb A1c MFr Bld: 7.4 % — ABNORMAL HIGH (ref <5.7)
MEAN PLASMA GLUCOSE: 166 mg/dL

## 2016-09-29 LAB — MICROALBUMIN / CREATININE URINE RATIO
CREATININE, URINE: 31 mg/dL (ref 20–370)
Microalb Creat Ratio: 87 mcg/mg creat — ABNORMAL HIGH (ref <30)
Microalb, Ur: 2.7 mg/dL

## 2016-10-01 ENCOUNTER — Encounter: Payer: Self-pay | Admitting: "Endocrinology

## 2016-10-01 ENCOUNTER — Ambulatory Visit (INDEPENDENT_AMBULATORY_CARE_PROVIDER_SITE_OTHER): Payer: Medicare (Managed Care) | Admitting: "Endocrinology

## 2016-10-01 VITALS — BP 160/78 | HR 70 | Ht 71.0 in | Wt 212.0 lb

## 2016-10-01 DIAGNOSIS — I10 Essential (primary) hypertension: Secondary | ICD-10-CM | POA: Diagnosis not present

## 2016-10-01 DIAGNOSIS — E118 Type 2 diabetes mellitus with unspecified complications: Secondary | ICD-10-CM

## 2016-10-01 DIAGNOSIS — E1165 Type 2 diabetes mellitus with hyperglycemia: Secondary | ICD-10-CM

## 2016-10-01 DIAGNOSIS — E782 Mixed hyperlipidemia: Secondary | ICD-10-CM | POA: Diagnosis not present

## 2016-10-01 DIAGNOSIS — Z794 Long term (current) use of insulin: Secondary | ICD-10-CM | POA: Diagnosis not present

## 2016-10-01 DIAGNOSIS — IMO0002 Reserved for concepts with insufficient information to code with codable children: Secondary | ICD-10-CM

## 2016-10-01 LAB — GLUCOSE, POCT (MANUAL RESULT ENTRY): POC GLUCOSE: 286 mg/dL — AB (ref 70–99)

## 2016-10-01 NOTE — Progress Notes (Signed)
Subjective:    Patient ID: Noah Daniel, male    DOB: 12/19/1940, PCP Alinda DeemStephen Jannach, MD   Past Medical History:  Diagnosis Date  . BPH (benign prostatic hyperplasia)   . CKD (chronic kidney disease)   . Diabetes mellitus, type II (HCC)   . Diabetic neuropathy (HCC)   . History of kidney cancer   . Hyperlipidemia   . Hypertension   . Osteoarthritis    Past Surgical History:  Procedure Laterality Date  . CARPAL TUNNEL RELEASE    . CATARACT EXTRACTION    . NEPHRECTOMY     Social History   Social History  . Marital status: Unknown    Spouse name: N/A  . Number of children: N/A  . Years of education: N/A   Social History Main Topics  . Smoking status: Former Games developermoker  . Smokeless tobacco: Never Used  . Alcohol use No  . Drug use: No  . Sexual activity: Not Asked   Other Topics Concern  . None   Social History Narrative  . None   Outpatient Encounter Prescriptions as of 10/01/2016  Medication Sig  . amLODipine (NORVASC) 5 MG tablet Take 5 mg by mouth daily.  Marland Kitchen. aspirin 81 MG tablet Take 81 mg by mouth daily.  . insulin aspart (NOVOLOG) 100 UNIT/ML injection Inject 10-16 Units into the skin 3 (three) times daily with meals.  . insulin glargine (LANTUS) 100 UNIT/ML injection INJECT 30 UNITS SUBCUTANEOUSLY AT BEDTIME  . losartan-hydrochlorothiazide (HYZAAR) 50-12.5 MG tablet Take 1 tablet by mouth daily.  . pravastatin (PRAVACHOL) 20 MG tablet Take 20 mg by mouth daily.  Marland Kitchen. RELION ULTIMA TEST test strip USE ONE STRIP TO CHECK GLUCOSE 4 TIMES DAILY AS DIRECTED  . Vitamin D, Cholecalciferol, 1000 UNITS CAPS Take by mouth daily.  . [DISCONTINUED] insulin aspart (NOVOLOG) 100 UNIT/ML injection INJECT 10-16 UNITS SUBCUTANEOUSLY THREE TIMES DAILY WITH MEALS   No facility-administered encounter medications on file as of 10/01/2016.    ALLERGIES: No Known Allergies VACCINATION STATUS:  There is no immunization history on file for this patient.  Diabetes  He presents  for his follow-up diabetic visit. He has type 2 diabetes mellitus. Onset time: He was diagnosed at approximate age of 38 years. His disease course has been stable. There are no hypoglycemic associated symptoms. Pertinent negatives for hypoglycemia include no confusion, headaches, pallor or seizures. Associated symptoms include fatigue. Pertinent negatives for diabetes include no chest pain, no polydipsia, no polyphagia, no polyuria and no weakness. There are no hypoglycemic complications. Symptoms are stable. Diabetic complications include peripheral neuropathy. 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 Current diabetic treatment includes intensive insulin program. He is compliant with treatment most of the time. His weight is stable. He is following a generally unhealthy diet. He has not had a previous visit with a dietitian. He participates in exercise intermittently. His home blood glucose trend is fluctuating dramatically (He [place] gets some random hypoglycemia but at much less frequency than before.). His breakfast blood glucose range is generally 140-180 mg/dl. His lunch blood glucose range is generally 140-180 mg/dl. His dinner blood glucose range is generally 140-180 mg/dl. His overall blood glucose range is 140-180 mg/dl. An ACE inhibitor/angiotensin II receptor blocker is being taken. Eye exam is current.  Hyperlipidemia  This is a chronic problem. The current episode started more than 1 year ago. Pertinent negatives include no  chest pain, myalgias or shortness of breath. Current antihyperlipidemic treatment includes statins. Risk factors for coronary artery disease include dyslipidemia, diabetes mellitus, hypertension, male sex and obesity.  Hypertension  This is a chronic problem. The current episode started more than 1 year ago. The problem is  uncontrolled. Pertinent negatives include no chest pain, headaches, neck pain, palpitations or shortness of breath. Risk factors for coronary artery disease include diabetes mellitus, dyslipidemia, male gender, obesity and smoking/tobacco exposure. Past treatments include angiotensin blockers and diuretics.     Review of Systems  Constitutional: Positive for fatigue. Negative for unexpected weight change.  HENT: Negative for dental problem, mouth sores and trouble swallowing.   Eyes: Negative for visual disturbance.  Respiratory: Negative for cough, choking, chest tightness, shortness of breath and wheezing.   Cardiovascular: Negative for chest pain, palpitations and leg swelling.  Gastrointestinal: Negative for abdominal distention, abdominal pain, constipation, diarrhea, nausea and vomiting.  Endocrine: Negative for polydipsia, polyphagia and polyuria.  Genitourinary: Negative for dysuria, flank pain, hematuria and urgency.  Musculoskeletal: Negative for back pain, gait problem, myalgias and neck pain.  Skin: Negative for pallor, rash and wound.  Neurological: Negative for seizures, syncope, weakness, numbness and headaches.  Psychiatric/Behavioral: Negative.  Negative for confusion and dysphoric mood.    Objective:    BP (!) 160/78   Pulse 70   Ht 5\' 11"  (1.803 m)   Wt 212 lb (96.2 kg)   BMI 29.57 kg/m   Wt Readings from Last 3 Encounters:  10/01/16 212 lb (96.2 kg)  06/25/16 213 lb 12.8 oz (97 kg)  03/19/16 210 lb (95.3 kg)    Physical Exam  Constitutional: He is oriented to person, place, and time. He appears well-developed and well-nourished. He is cooperative. No distress.  HENT:  Head: Normocephalic and atraumatic.  Eyes: EOM are normal.  Neck: Normal range of motion. Neck supple. No tracheal deviation present. No thyromegaly present.  Cardiovascular: Normal rate, S1 normal, S2 normal and normal heart sounds.  Exam reveals no gallop.   No murmur heard. Pulses:       Dorsalis pedis pulses are 1+ on the right side, and 1+ on the left side.       Posterior tibial pulses are 1+ on the right side, and 1+ on the left side.  Pulmonary/Chest: Breath sounds normal. No respiratory distress. He has no wheezes.  Abdominal: Soft. Bowel sounds are normal. He exhibits no distension. There is no tenderness. There is no guarding and no CVA tenderness.  Musculoskeletal: He exhibits no edema.       Right shoulder: He exhibits no swelling and no deformity.  Neurological: He is alert and oriented to person, place, and time. He has normal strength and normal reflexes. No cranial nerve deficit or sensory deficit. Gait normal.  Skin: Skin is warm and dry. No rash noted. No cyanosis. Nails show no clubbing.  Psychiatric: He has a normal mood and affect. His speech is normal and behavior is normal. Judgment and thought content normal. Cognition and memory are normal.   Recent Results (from the past 2160 hour(s))  COMPLETE METABOLIC PANEL WITH GFR     Status: Abnormal   Collection Time: 09/28/16  8:59 AM  Result Value Ref Range   Sodium 139 135 - 146 mmol/L   Potassium 4.1 3.5 - 5.3 mmol/L   Chloride 103 98 - 110 mmol/L   CO2 28 20 - 31 mmol/L   Glucose, Bld 192 (H) 65 - 99 mg/dL   BUN  16 7 - 25 mg/dL   Creat 1.61 0.96 - 0.45 mg/dL    Comment:   For patients > or = 77 years of age: The upper reference limit for Creatinine is approximately 13% higher for people identified as African-American.      Total Bilirubin 1.4 (H) 0.2 - 1.2 mg/dL   Alkaline Phosphatase 85 40 - 115 U/L   AST 18 10 - 35 U/L   ALT 16 9 - 46 U/L   Total Protein 5.6 (L) 6.1 - 8.1 g/dL   Albumin 3.8 3.6 - 5.1 g/dL   Calcium 9.4 8.6 - 40.9 mg/dL   GFR, Est African American 75 >=60 mL/min   GFR, Est Non African American 65 >=60 mL/min  Microalbumin / creatinine urine ratio     Status: Abnormal   Collection Time: 09/28/16  8:59 AM  Result Value Ref Range   Creatinine, Urine 31 20 - 370 mg/dL    Microalb, Ur 2.7 Not estab mg/dL   Microalb Creat Ratio 87 (H) <30 mcg/mg creat    Comment: The ADA has defined abnormalities in albumin excretion as follows:           Category           Result                            (mcg/mg creatinine)                 Normal:    <30       Microalbuminuria:    30 - 299   Clinical albuminuria:    > or = 300   The ADA recommends that at least two of three specimens collected within a 3 - 6 month period be abnormal before considering a patient to be within a diagnostic category.     Lipid panel     Status: None   Collection Time: 09/28/16  8:59 AM  Result Value Ref Range   Cholesterol 132 <200 mg/dL   Triglycerides 80 <811 mg/dL   HDL 42 >91 mg/dL   Total CHOL/HDL Ratio 3.1 <5.0 Ratio   VLDL 16 <30 mg/dL   LDL Cholesterol 74 <478 mg/dL  Hemoglobin G9F     Status: Abnormal   Collection Time: 09/28/16  8:59 AM  Result Value Ref Range   Hgb A1c MFr Bld 7.4 (H) <5.7 %    Comment:   For someone without known diabetes, a hemoglobin A1c value of 6.5% or greater indicates that they may have diabetes and this should be confirmed with a follow-up test.   For someone with known diabetes, a value <7% indicates that their diabetes is well controlled and a value greater than or equal to 7% indicates suboptimal control. A1c targets should be individualized based on duration of diabetes, age, comorbid conditions, and other considerations.   Currently, no consensus exists for use of hemoglobin A1c for diagnosis of diabetes for children.      Mean Plasma Glucose 166 mg/dL  POCT glucose (manual entry)     Status: Abnormal   Collection Time: 10/01/16  8:32 AM  Result Value Ref Range   POC Glucose 286 (A) 70 - 99 mg/dl    Comment: 30 min PC     Assessment & Plan:   1. Uncontrolled type 2 diabetes mellitus with complication, with long-term current use of insulin (HCC) - patient remains at a high risk for more acute and chronic  complications of  diabetes which include CAD, CVA, CKD, retinopathy, and neuropathy. These are all discussed in detail with the patient.  Patient came with improved  glucose profile, and  recent A1c of 7.4 % improving from 8.6 %.   - he has a better consistency of glycemia than before and has much less frequent hypoglycemia. -   He insists that he has to control diabetes to a1c of 6%. I discussed with him safe target for his a1c is 7.5-8%. He is reluctant.   - Glucose logs and insulin administration records pertaining to this visit,  to be scanned into patient's records.  Recent labs reviewed.   - I have re-counseled the patient on diet management and weight loss  by adopting a carbohydrate restricted / protein rich  Diet.  - Suggestion is made for patient to avoid simple carbohydrates   from their diet including Cakes , Desserts, Ice Cream,  Soda (  diet and regular) , Sweet Tea , Candies,  Chips, Cookies, Artificial Sweeteners,   and "Sugar-free" Products .  This will help patient to have stable blood glucose profile and potentially avoid unintended  Weight gain.  - Patient is advised to stick to a routine mealtimes to eat 3 meals  a day and avoid unnecessary snacks ( to snack only to correct hypoglycemia).  - I have approached patient with the following individualized plan to manage diabetes and patient agrees.  -I will continue Lantus  30 units qhs, continue Novolog 10 units TIDAC plus correction, when pre-meal blood glucoses above 90 mg/dL.  -I have advised him to stop the  5 units of NovoLog when pre-meal blood glucose is between 70 and 90 mg/dL . -Patient is warned not to take insulin without proper monitoring per orders. - The DEXCOM would have helped him , but does not want to wear a sensor.  -Patient is encouraged to call clinic for blood glucose levels less than 70 or above 300 mg /dl.  -Due to his renal hx , he is not a suitable candidate for MTF, SGLT2 inhibitors, and declined DPPV  inhibitors.  - Patient specific target  for A1c; LDL, HDL, Triglycerides, and  Waist Circumference were discussed in detail.  2) BP/HTN: uncontrolled. Continue current medications including ACEI/ARB. 3) Lipids/HPL: Controlled, LDL at 74. I advised him to  continue statins.  4)  Weight/Diet: CDE consult in progress, exercise, and carbohydrates information provided.  5) Chronic Care/Health Maintenance:  -Patient is on ACEI/ARB and Statin medications and encouraged to continue to follow up with Ophthalmology, Podiatrist at least yearly or according to recommendations, and advised to  stay away from smoking. I have recommended yearly flu vaccine and pneumonia vaccination at least every 5 years; moderate intensity exercise for up to 150 minutes weekly; and  sleep for at least 7 hours a day.  - 25 minutes of time was spent on the care of this patient , 50% of which was applied for counseling on diabetes complications and their preventions.  - I advised patient to maintain close follow up with Alinda Deem, MD for primary care needs.  Patient is asked to bring meter and  blood glucose logs during their next visit.   Follow up plan: -Return in about 3 months (around 12/30/2016) for follow up with pre-visit labs, meter, and logs.  Marquis Lunch, MD Phone: [phone]  Fax: [phone]   10/01/2016, 8:42 AM

## 2016-10-01 NOTE — Patient Instructions (Signed)

## 2016-10-25 ENCOUNTER — Other Ambulatory Visit: Payer: Self-pay

## 2016-10-25 MED ORDER — BLOOD GLUCOSE MONITOR KIT: PACK | 0 refills | Status: DC

## 2016-10-25 NOTE — Telephone Encounter (Signed)
New rx sent to [facility] for glucometer and strips. Pt states that his insurance will no longer cover his current meter

## 2016-10-30 ENCOUNTER — Other Ambulatory Visit: Payer: Self-pay

## 2016-10-30 MED ORDER — BLOOD GLUCOSE MONITOR KIT: PACK | 0 refills | Status: DC

## 2016-11-21 LAB — HEMOGLOBIN A1C: Hemoglobin A1C: 8.4

## 2016-11-23 ENCOUNTER — Other Ambulatory Visit: Payer: Self-pay | Admitting: "Endocrinology

## 2016-12-10 ENCOUNTER — Other Ambulatory Visit: Payer: Self-pay | Admitting: "Endocrinology

## 2016-12-25 ENCOUNTER — Other Ambulatory Visit: Payer: Self-pay | Admitting: "Endocrinology

## 2016-12-26 ENCOUNTER — Other Ambulatory Visit: Payer: Self-pay

## 2016-12-26 MED ORDER — INSULIN GLARGINE 100 UNIT/ML ~~LOC~~ SOLN: SUBCUTANEOUS | 0 refills | Status: DC

## 2016-12-31 ENCOUNTER — Ambulatory Visit: Payer: Medicare (Managed Care) | Admitting: "Endocrinology

## 2017-01-09 ENCOUNTER — Ambulatory Visit (INDEPENDENT_AMBULATORY_CARE_PROVIDER_SITE_OTHER): Payer: Medicare FFS | Admitting: "Endocrinology

## 2017-01-09 ENCOUNTER — Encounter: Payer: Self-pay | Admitting: "Endocrinology

## 2017-01-09 VITALS — BP 139/75 | HR 68 | Ht 71.0 in | Wt 208.0 lb

## 2017-01-09 DIAGNOSIS — I10 Essential (primary) hypertension: Secondary | ICD-10-CM

## 2017-01-09 DIAGNOSIS — Z794 Long term (current) use of insulin: Secondary | ICD-10-CM

## 2017-01-09 DIAGNOSIS — E1165 Type 2 diabetes mellitus with hyperglycemia: Secondary | ICD-10-CM

## 2017-01-09 DIAGNOSIS — E118 Type 2 diabetes mellitus with unspecified complications: Secondary | ICD-10-CM

## 2017-01-09 DIAGNOSIS — IMO0002 Reserved for concepts with insufficient information to code with codable children: Secondary | ICD-10-CM

## 2017-01-09 DIAGNOSIS — E782 Mixed hyperlipidemia: Secondary | ICD-10-CM | POA: Diagnosis not present

## 2017-01-09 LAB — GLUCOSE, POCT (MANUAL RESULT ENTRY): POC GLUCOSE: 374 mg/dL — AB (ref 70–99)

## 2017-01-09 MED ORDER — INSULIN ASPART 100 UNIT/ML ~~LOC~~ SOLN: 12.0000 [IU] | Freq: Three times a day (TID) | SUBCUTANEOUS | 0 refills | Status: DC

## 2017-01-09 NOTE — Progress Notes (Signed)
Subjective:    Patient ID: Noah Daniel, male    DOB: [birth year], PCP Earney Mallet, MD   Past Medical History:  Diagnosis Date  . BPH (benign prostatic hyperplasia)   . CKD (chronic kidney disease)   . Diabetes mellitus, type II ([place])   . Diabetic neuropathy ([place])   . History of kidney cancer   . Hyperlipidemia   . Hypertension   . Osteoarthritis    Past Surgical History:  Procedure Laterality Date  . CARPAL TUNNEL RELEASE    . CATARACT EXTRACTION    . NEPHRECTOMY     Social History   Social History  . Marital status: Unknown    Spouse name: N/A  . Number of children: N/A  . Years of education: N/A   Social History Main Topics  . Smoking status: Former Research scientist (life sciences)  . Smokeless tobacco: Never Used  . Alcohol use No  . Drug use: No  . Sexual activity: Not Asked   Other Topics Concern  . None   Social History Narrative  . None   Outpatient Encounter Prescriptions as of 01/09/2017  Medication Sig  . amLODipine (NORVASC) 5 MG tablet Take 5 mg by mouth daily.  Marland Kitchen aspirin 81 MG tablet Take 81 mg by mouth daily.  . blood glucose meter kit and supplies KIT Dispense based on patient and insurance preference. Use up to four times daily as directed. (FOR ICD-10 E11.65)  . blood glucose meter kit and supplies KIT Dispense based on patient and insurance preference. Use up to four times daily as directed. (FOR ICD-10 E11.65)  . Blood Glucose Monitoring Suppl (TRUE METRIX METER) w/Device KIT USE TO TEST BLOOD SUGAR UP TO 4 TIMES A DAY AS DIRECTED  . insulin aspart (NOVOLOG) 100 UNIT/ML injection Inject 12-18 Units into the skin 3 (three) times daily with meals.  . insulin glargine (LANTUS) 100 UNIT/ML injection INJECT 30 UNITS SUBCUTANEOUSLY AT BEDTIME  . losartan-hydrochlorothiazide (HYZAAR) 50-12.5 MG tablet Take 1 tablet by mouth daily.  . pravastatin (PRAVACHOL) 20 MG tablet Take 20 mg by mouth daily.  . TRUE METRIX BLOOD GLUCOSE TEST test strip USE TO TEST BLOOD SUGAR UP  TO 4 TIMES A DAY AS DIRECTED  . Vitamin D, Cholecalciferol, 1000 UNITS CAPS Take by mouth daily.  . [DISCONTINUED] insulin aspart (NOVOLOG) 100 UNIT/ML injection Inject 10-16 Units into the skin 3 (three) times daily with meals.   No facility-administered encounter medications on file as of 01/09/2017.    ALLERGIES: No Known Allergies VACCINATION STATUS:  There is no immunization history on file for this patient.  Diabetes  He presents for his follow-up diabetic visit. He has type 2 diabetes mellitus. Onset time: He was diagnosed at approximate age of 35 years. His disease course has been stable. There are no hypoglycemic associated symptoms. Pertinent negatives for hypoglycemia include no confusion, headaches, pallor or seizures. Associated symptoms include fatigue. Pertinent negatives for diabetes include no chest pain, no polydipsia, no polyphagia, no polyuria and no weakness. There are no hypoglycemic complications. Symptoms are stable. Diabetic complications include peripheral neuropathy. 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 Current diabetic treatment includes intensive insulin program. He is compliant with treatment most of the time. His weight is stable. He is following a generally unhealthy diet. He has not had a previous visit with a dietitian. He participates in exercise intermittently. His home blood glucose trend  is fluctuating dramatically (He [place] gets some random hypoglycemia but at much less frequency than before.). His breakfast blood glucose range is generally 140-180 mg/dl. His lunch blood glucose range is generally 140-180 mg/dl. His dinner blood glucose range is generally 140-180 mg/dl. His overall blood glucose range is 140-180 mg/dl. An ACE inhibitor/angiotensin II receptor blocker is being taken. Eye exam is current.   Hyperlipidemia  This is a chronic problem. The current episode started more than 1 year ago. Pertinent negatives include no chest pain, myalgias or shortness of breath. Current antihyperlipidemic treatment includes statins. Risk factors for coronary artery disease include dyslipidemia, diabetes mellitus, hypertension, male sex and obesity.  Hypertension  This is a chronic problem. The current episode started more than 1 year ago. The problem is uncontrolled. Pertinent negatives include no chest pain, headaches, neck pain, palpitations or shortness of breath. Risk factors for coronary artery disease include diabetes mellitus, dyslipidemia, male gender, obesity and smoking/tobacco exposure. Past treatments include angiotensin blockers and diuretics.     Review of Systems  Constitutional: Positive for fatigue. Negative for unexpected weight change.  HENT: Negative for dental problem, mouth sores and trouble swallowing.   Eyes: Negative for visual disturbance.  Respiratory: Negative for cough, choking, chest tightness, shortness of breath and wheezing.   Cardiovascular: Negative for chest pain, palpitations and leg swelling.  Gastrointestinal: Negative for abdominal distention, abdominal pain, constipation, diarrhea, nausea and vomiting.  Endocrine: Negative for polydipsia, polyphagia and polyuria.  Genitourinary: Negative for dysuria, flank pain, hematuria and urgency.  Musculoskeletal: Negative for back pain, gait problem, myalgias and neck pain.  Skin: Negative for pallor, rash and wound.  Neurological: Negative for seizures, syncope, weakness, numbness and headaches.  Psychiatric/Behavioral: Negative.  Negative for confusion and dysphoric mood.    Objective:    BP 139/75   Pulse 68   Ht '5\' 11"'  (1.803 m)   Wt 208 lb (94.3 kg)   BMI 29.01 kg/m   Wt Readings from Last 3 Encounters:  01/09/17 208 lb (94.3 kg)  10/01/16 212 lb (96.2 kg)  06/25/16 213 lb 12.8 oz (97 kg)    Physical  Exam  Constitutional: He is oriented to person, place, and time. He appears well-developed and well-nourished. He is cooperative. No distress.  HENT:  Head: Normocephalic and atraumatic.  Eyes: EOM are normal.  Neck: Normal range of motion. Neck supple. No tracheal deviation present. No thyromegaly present.  Cardiovascular: Normal rate, S1 normal, S2 normal and normal heart sounds.  Exam reveals no gallop.   No murmur heard. Pulses:      Dorsalis pedis pulses are 1+ on the right side, and 1+ on the left side.       Posterior tibial pulses are 1+ on the right side, and 1+ on the left side.  Pulmonary/Chest: Breath sounds normal. No respiratory distress. He has no wheezes.  Abdominal: Soft. Bowel sounds are normal. He exhibits no distension. There is no tenderness. There is no guarding and no CVA tenderness.  Musculoskeletal: He exhibits no edema.       Right shoulder: He exhibits no swelling and no deformity.  Neurological: He is alert and oriented to person, place, and time. He has normal strength and normal reflexes. No cranial nerve deficit or sensory deficit. Gait normal.  Skin: Skin is warm and dry. No rash noted. No cyanosis. Nails show no clubbing.  Psychiatric: He has a normal mood and affect. His speech is normal and behavior is normal. Judgment and thought content normal.  Cognition and memory are normal.   Recent Results (from the past 2160 hour(s))  POCT glucose (manual entry)     Status: Abnormal   Collection Time: 01/09/17  9:49 AM  Result Value Ref Range   POC Glucose 374 (A) 70 - 99 mg/dl    Comment: 1 hr PC     Assessment & Plan:   1. Uncontrolled type 2 diabetes mellitus with complication, with long-term current use of insulin ([place])  - Patient remains at a high risk for more acute and chronic complications of diabetes which include CAD, CVA, CKD, retinopathy, and neuropathy. These are all discussed in detail with the patient.  Patient came with higher  glucose  profile, and  recent A1c of  8.4% increasing from 7.4 %.  - he has a better consistency of glycemia than before and has much less frequent hypoglycemia. -   He insists that he has to control diabetes to a1c of 6%. I discussed with him safe target for his a1c is 7.5-8%. He is reluctant.   - Glucose logs and insulin administration records pertaining to this visit,  to be scanned into patient's records.  Recent labs reviewed.   - I have re-counseled the patient on diet management and weight loss  by adopting a carbohydrate restricted / protein rich  Diet.  - Suggestion is made for patient to avoid simple carbohydrates   from their diet including Cakes , Desserts, Ice Cream,  Soda (  diet and regular) , Sweet Tea , Candies,  Chips, Cookies, Artificial Sweeteners,   and "Sugar-free" Products .  This will help patient to have stable blood glucose profile and potentially avoid unintended  Weight gain.  - Patient is advised to stick to a routine mealtimes to eat 3 meals  a day and avoid unnecessary snacks ( to snack only to correct hypoglycemia).  - I have approached patient with the following individualized plan to manage diabetes and patient agrees.  -I will continue Lantus  30 units qhs, increase NovoLog to 10 units 3 times a day before meals plus correction, when pre-meal blood glucoses above 90 mg/dL.  -I have advised him to stop the  5 units of NovoLog when pre-meal blood glucose is between 70 and 90 mg/dL . -Patient is warned not to take insulin without proper monitoring per orders. - The DEXCOM would have helped him , but does not want to wear a sensor.  -Patient is encouraged to call clinic for blood glucose levels less than 70 or above 300 mg /dl.  -Due to his renal hx , he is not a suitable candidate for MTF, SGLT2 inhibitors, and declined DPPV inhibitors.  - Patient specific target  for A1c; LDL, HDL, Triglycerides, and  Waist Circumference were discussed in detail.  2) BP/HTN:  uncontrolled. Continue current medications including ACEI/ARB. 3) Lipids/HPL: Controlled, LDL at 74. I advised him to  continue statins.  4)  Weight/Diet: CDE consult in progress, exercise, and carbohydrates information provided.  5) Chronic Care/Health Maintenance:  -Patient is on ACEI/ARB and Statin medications and encouraged to continue to follow up with Ophthalmology, Podiatrist at least yearly or according to recommendations, and advised to  stay away from smoking. I have recommended yearly flu vaccine and pneumonia vaccination at least every 5 years; moderate intensity exercise for up to 150 minutes weekly; and  sleep for at least 7 hours a day.  - 25 minutes of time was spent on the care of this patient , 50% of  which was applied for counseling on diabetes complications and their preventions.  - I advised patient to maintain close follow up with Earney Mallet, MD for primary care needs.  Patient is asked to bring meter and  blood glucose logs during their next visit.   Follow up plan: -Return in about 3 months (around 04/11/2017) for follow up with pre-visit labs, meter, and logs.  Glade Lloyd, MD Phone: [phone]  Fax: [phone]   01/09/2017, 9:55 AM

## 2017-01-09 NOTE — Patient Instructions (Signed)

## 2017-03-19 ENCOUNTER — Other Ambulatory Visit: Payer: Self-pay

## 2017-03-19 MED ORDER — BLOOD GLUCOSE MONITOR KIT: PACK | 0 refills | Status: AC

## 2017-05-01 ENCOUNTER — Ambulatory Visit: Payer: Medicare FFS | Admitting: "Endocrinology

## 2017-05-27 ENCOUNTER — Other Ambulatory Visit: Payer: Self-pay | Admitting: "Endocrinology

## 2017-05-27 DIAGNOSIS — E1165 Type 2 diabetes mellitus with hyperglycemia: Secondary | ICD-10-CM

## 2017-05-27 DIAGNOSIS — E118 Type 2 diabetes mellitus with unspecified complications: Principal | ICD-10-CM

## 2017-05-27 DIAGNOSIS — E785 Hyperlipidemia, unspecified: Secondary | ICD-10-CM

## 2017-05-27 LAB — COMPREHENSIVE METABOLIC PANEL
ALK PHOS: 84 U/L (ref 40–115)
ALT: 13 U/L (ref 9–46)
AST: 12 U/L (ref 10–35)
Albumin: 3.9 g/dL (ref 3.6–5.1)
BUN: 16 mg/dL (ref 7–25)
CO2: 21 mmol/L (ref 20–32)
CREATININE: 1.19 mg/dL — AB (ref 0.70–1.18)
Calcium: 9.9 mg/dL (ref 8.6–10.3)
Chloride: 101 mmol/L (ref 98–110)
GLUCOSE: 315 mg/dL — AB (ref 65–99)
POTASSIUM: 4.4 mmol/L (ref 3.5–5.3)
SODIUM: 134 mmol/L — AB (ref 135–146)
Total Bilirubin: 1 mg/dL (ref 0.2–1.2)
Total Protein: 5.8 g/dL — ABNORMAL LOW (ref 6.1–8.1)

## 2017-05-27 LAB — LIPID PANEL
CHOL/HDL RATIO: 4.3 ratio (ref <5.0)
Cholesterol: 165 mg/dL (ref <200)
HDL: 38 mg/dL — AB (ref >40)
LDL-Cholesterol: 104 mg/dL — ABNORMAL HIGH
Non-HDL Cholesterol (Calc): 127 mg/dL (ref <130)
Triglycerides: 133 mg/dL (ref <150)

## 2017-05-28 LAB — HEMOGLOBIN A1C
HEMOGLOBIN A1C: 8.1 % — AB (ref <5.7)
Mean Plasma Glucose: 186 mg/dL

## 2017-05-30 LAB — HM DIABETES EYE EXAM

## 2017-05-31 ENCOUNTER — Ambulatory Visit (INDEPENDENT_AMBULATORY_CARE_PROVIDER_SITE_OTHER): Payer: Medicare FFS | Admitting: "Endocrinology

## 2017-05-31 ENCOUNTER — Encounter: Payer: Self-pay | Admitting: "Endocrinology

## 2017-05-31 VITALS — BP 145/89 | HR 62 | Ht 71.0 in | Wt 206.0 lb

## 2017-05-31 DIAGNOSIS — I10 Essential (primary) hypertension: Secondary | ICD-10-CM

## 2017-05-31 DIAGNOSIS — E1165 Type 2 diabetes mellitus with hyperglycemia: Secondary | ICD-10-CM | POA: Diagnosis not present

## 2017-05-31 DIAGNOSIS — E118 Type 2 diabetes mellitus with unspecified complications: Secondary | ICD-10-CM

## 2017-05-31 DIAGNOSIS — IMO0002 Reserved for concepts with insufficient information to code with codable children: Secondary | ICD-10-CM

## 2017-05-31 DIAGNOSIS — Z794 Long term (current) use of insulin: Secondary | ICD-10-CM | POA: Diagnosis not present

## 2017-05-31 DIAGNOSIS — E782 Mixed hyperlipidemia: Secondary | ICD-10-CM

## 2017-05-31 MED ORDER — INSULIN ASPART 100 UNIT/ML ~~LOC~~ SOLN: 12.0000 [IU] | Freq: Three times a day (TID) | SUBCUTANEOUS | 0 refills | Status: DC

## 2017-05-31 MED ORDER — "INSULIN SYRINGE-NEEDLE U-100 29G X 1/2"" 0.3 ML MISC": 2 refills | Status: DC

## 2017-05-31 NOTE — Progress Notes (Signed)
Subjective:    Patient ID: Noah Daniel, male    DOB: [birth year], PCP Earney Mallet, MD   Past Medical History:  Diagnosis Date  . BPH (benign prostatic hyperplasia)   . CKD (chronic kidney disease)   . Diabetes mellitus, type II ([place])   . Diabetic neuropathy ([place])   . History of kidney cancer   . Hyperlipidemia   . Hypertension   . Osteoarthritis    Past Surgical History:  Procedure Laterality Date  . CARPAL TUNNEL RELEASE    . CATARACT EXTRACTION    . NEPHRECTOMY     Social History   Social History  . Marital status: Unknown    Spouse name: N/A  . Number of children: N/A  . Years of education: N/A   Social History Main Topics  . Smoking status: Former Research scientist (life sciences)  . Smokeless tobacco: Never Used  . Alcohol use No  . Drug use: No  . Sexual activity: Not Asked   Other Topics Concern  . None   Social History Narrative  . None   Outpatient Encounter Prescriptions as of 05/31/2017  Medication Sig  . amLODipine (NORVASC) 5 MG tablet Take 5 mg by mouth daily.  Marland Kitchen aspirin 81 MG tablet Take 81 mg by mouth daily.  . blood glucose meter kit and supplies KIT Dispense based on patient and insurance preference. Use up to four times daily as directed. (FOR ICD-10 E11.65)  . Blood Glucose Monitoring Suppl (TRUE METRIX METER) w/Device KIT USE TO TEST BLOOD SUGAR UP TO 4 TIMES A DAY AS DIRECTED  . insulin aspart (NOVOLOG) 100 UNIT/ML injection Inject 12-18 Units into the skin 3 (three) times daily with meals.  . insulin glargine (LANTUS) 100 UNIT/ML injection INJECT 30 UNITS SUBCUTANEOUSLY AT BEDTIME  . Insulin Syringe-Needle U-100 (RELION INSULIN SYR .3CC/29G) 29G X 1/2" 0.3 ML MISC Use to inject insulin 4 times a day.  . losartan-hydrochlorothiazide (HYZAAR) 50-12.5 MG tablet Take 1 tablet by mouth daily.  . TRUE METRIX BLOOD GLUCOSE TEST test strip USE TO TEST BLOOD SUGAR UP TO 4 TIMES A DAY AS DIRECTED  . Vitamin D, Cholecalciferol, 1000 UNITS CAPS Take by mouth daily.  .  [DISCONTINUED] insulin aspart (NOVOLOG) 100 UNIT/ML injection Inject 12-18 Units into the skin 3 (three) times daily with meals.  . [DISCONTINUED] pravastatin (PRAVACHOL) 20 MG tablet Take 20 mg by mouth daily.   No facility-administered encounter medications on file as of 05/31/2017.    ALLERGIES: No Known Allergies VACCINATION STATUS:  There is no immunization history on file for this patient.  Diabetes  He presents for his follow-up diabetic visit. He has type 2 diabetes mellitus. Onset time: He was diagnosed at approximate age of 76 years. His disease course has been improving. There are no hypoglycemic associated symptoms. Pertinent negatives for hypoglycemia include no confusion, headaches, pallor or seizures. Associated symptoms include fatigue. Pertinent negatives for diabetes include no chest pain, no polydipsia, no polyphagia, no polyuria and no weakness. There are no hypoglycemic complications. Symptoms are improving. Diabetic complications include peripheral neuropathy. 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 Current diabetic treatment includes intensive insulin program. He is compliant with treatment most of the time. His weight is stable. He is following a generally unhealthy diet. He has not had a previous visit with a dietitian. He participates in exercise intermittently. His home blood glucose trend is fluctuating minimally (  He Chiem gets some random hypoglycemia but at much less frequency than before.). His breakfast blood glucose range is generally 180-200 mg/dl. His lunch blood glucose range is generally 180-200 mg/dl. His dinner blood glucose range is generally 180-200 mg/dl. His overall blood glucose range is 180-200 mg/dl. An ACE inhibitor/angiotensin II receptor blocker is being taken. Eye exam is current.  Hyperlipidemia   This is a chronic problem. The current episode started more than 1 year ago. Pertinent negatives include no chest pain, myalgias or shortness of breath. Current antihyperlipidemic treatment includes statins. Risk factors for coronary artery disease include dyslipidemia, diabetes mellitus, hypertension, male sex and obesity.  Hypertension  This is a chronic problem. The current episode started more than 1 year ago. The problem is uncontrolled. Pertinent negatives include no chest pain, headaches, neck pain, palpitations or shortness of breath. Risk factors for coronary artery disease include diabetes mellitus, dyslipidemia, male gender, obesity and smoking/tobacco exposure. Past treatments include angiotensin blockers and diuretics.    Review of Systems  Constitutional: Positive for fatigue. Negative for unexpected weight change.  HENT: Negative for dental problem, mouth sores and trouble swallowing.   Eyes: Negative for visual disturbance.  Respiratory: Negative for cough, choking, chest tightness, shortness of breath and wheezing.   Cardiovascular: Negative for chest pain, palpitations and leg swelling.  Gastrointestinal: Negative for abdominal distention, abdominal pain, constipation, diarrhea, nausea and vomiting.  Endocrine: Negative for polydipsia, polyphagia and polyuria.  Genitourinary: Negative for dysuria, flank pain, hematuria and urgency.  Musculoskeletal: Negative for back pain, gait problem, myalgias and neck pain.  Skin: Negative for pallor, rash and wound.  Neurological: Negative for seizures, syncope, weakness, numbness and headaches.  Psychiatric/Behavioral: Negative.  Negative for confusion and dysphoric mood.    Objective:    BP (!) 145/89   Pulse 62   Ht 5' 11" (1.803 m)   Wt 206 lb (93.4 kg)   BMI 28.73 kg/m   Wt Readings from Last 3 Encounters:  05/31/17 206 lb (93.4 kg)  01/09/17 208 lb (94.3 kg)  10/01/16 212 lb (96.2 kg)    Physical Exam  Constitutional:  He is oriented to person, place, and time. He appears well-developed and well-nourished. He is cooperative. No distress.  HENT:  Head: Normocephalic and atraumatic.  Eyes: EOM are normal.  Neck: Normal range of motion. Neck supple. No tracheal deviation present. No thyromegaly present.  Cardiovascular: Normal rate, S1 normal, S2 normal and normal heart sounds.  Exam reveals no gallop.   No murmur heard. Pulses:      Dorsalis pedis pulses are 1+ on the right side, and 1+ on the left side.       Posterior tibial pulses are 1+ on the right side, and 1+ on the left side.  Pulmonary/Chest: Breath sounds normal. No respiratory distress. He has no wheezes.  Abdominal: Soft. Bowel sounds are normal. He exhibits no distension. There is no tenderness. There is no guarding and no CVA tenderness.  Musculoskeletal: He exhibits no edema.       Right shoulder: He exhibits no swelling and no deformity.  Neurological: He is alert and oriented to person, place, and time. He has normal strength and normal reflexes. No cranial nerve deficit or sensory deficit. Gait normal.  Skin: Skin is warm and dry. No rash noted. No cyanosis. Nails show no clubbing.  Psychiatric: He has a normal mood and affect. His speech is normal and behavior is normal. Judgment and thought content normal. Cognition and memory are normal.     Recent Results (from the past 2160 hour(s))  Comprehensive metabolic panel     Status: Abnormal   Collection Time: 05/27/17 11:32 AM  Result Value Ref Range   Sodium 134 (L) 135 - 146 mmol/L   Potassium 4.4 3.5 - 5.3 mmol/L   Chloride 101 98 - 110 mmol/L   CO2 21 20 - 32 mmol/L    Comment: ** Please note change in reference range(s). **      Glucose, Bld 315 (H) 65 - 99 mg/dL   BUN 16 7 - 25 mg/dL   Creat 1.19 (H) 0.70 - 1.18 mg/dL    Comment:   For patients > or = 78 years of age: The upper reference limit for Creatinine is approximately 13% higher for people identified  as African-American.      Total Bilirubin 1.0 0.2 - 1.2 mg/dL   Alkaline Phosphatase 84 40 - 115 U/L   AST 12 10 - 35 U/L   ALT 13 9 - 46 U/L   Total Protein 5.8 (L) 6.1 - 8.1 g/dL   Albumin 3.9 3.6 - 5.1 g/dL   Calcium 9.9 8.6 - 10.3 mg/dL  Lipid Panel     Status: Abnormal   Collection Time: 05/27/17 11:32 AM  Result Value Ref Range   Cholesterol 165 <200 mg/dL   Triglycerides 133 <150 mg/dL   HDL 38 (L) >40 mg/dL   Total CHOL/HDL Ratio 4.3 <5.0 Ratio   Non-HDL Cholesterol (Calc) 127 <130 mg/dL    Comment:   For patients with diabetes plus 1 major ASCVD risk factor, treating to a non-HDL-C goal of <100 mg/dL (LDL-C of <70 mg/dL) is considered a therapeutic option.      LDL-Cholesterol 104 (H) mg/dL    Comment: Reference range: <100   Desirable range <100 mg/dL for primary prevention; <70 mg/dL for patients with CHD or diabetic patients with > or = 2 CHD risk factors.     The Martin-Hopkins calculation is a validated novel method that provides better accuracy than the Friedwald equation in the estimation of LDL-C, particularly when TG levels are 150-400 mg/dL and LDL-C levels are lower than 70 mg/dL. Reference:  Cresenciano Genre et al.  Comparison of a Novel Method vs the Aurelio Jew for Estimating Low-Density Lipoprotein Cholesterol Levels From the Standard Lipid Profile.  JAMA. 4742;595(63): 2061-2068.   For additional information, please refer to http://education.[facility].com/faq/FAQ164 (This link is being provided for informational/educational purposes only.)   Hemoglobin A1c     Status: Abnormal   Collection Time: 05/27/17 11:32 AM  Result Value Ref Range   Hgb A1c MFr Bld 8.1 (H) <5.7 %    Comment:   For someone without known diabetes, a hemoglobin A1c value of 6.5% or greater indicates that they may have diabetes and this should be confirmed with a follow-up test.   For someone with known diabetes, a value <7% indicates that their diabetes is well  controlled and a value greater than or equal to 7% indicates suboptimal control. A1c targets should be individualized based on duration of diabetes, age, comorbid conditions, and other considerations.   Currently, no consensus exists for use of hemoglobin A1c for diagnosis of diabetes for children.      Mean Plasma Glucose 186 mg/dL     Assessment & Plan:   1. Uncontrolled type 2 diabetes mellitus with complication, with long-term current use of insulin ([place])  - Patient remains at a high risk for more acute and chronic complications of diabetes which include CAD,  CVA, CKD, retinopathy, and neuropathy. These are all discussed in detail with the patient.  Patient came with better  glucose profile, and  recent A1c of  8.1% improving from 8.4% .  - he has a better consistency of glycemia than before and has much less frequent hypoglycemia. -   He insists that he has to control diabetes to a1c of 6%. I discussed with him safe target for his a1c is 7.5-8%. He is reluctant.   - Glucose logs and insulin administration records pertaining to this visit,  to be scanned into patient's records.  Recent labs reviewed.   - I have re-counseled the patient on diet management and weight loss  by adopting a carbohydrate restricted / protein rich  Diet.  Suggestion is made for him to avoid simple carbohydrates  from his diet including Cakes, Sweet Desserts, Ice Cream, Soda (diet and regular), Sweet Tea, Candies, Chips, Cookies, Store Bought Juices, Alcohol in Excess of  1-2 drinks a day, Artificial Sweeteners, and "Sugar-free" Products. This will help patient to have stable blood glucose profile and potentially avoid unintended weight gain. .  - Patient is advised to stick to a routine mealtimes to eat 3 meals  a day and avoid unnecessary snacks ( to snack only to correct hypoglycemia).  - I have approached patient with the following individualized plan to manage diabetes and patient agrees.  -I will  continue Lantus  30 units qhs, continue NovoLog 12 units 3 times a day before meals plus correction, when pre-meal blood glucoses above 90 mg/dL.  -I have advised him to stop the  5 units of NovoLog when pre-meal blood glucose is between 70 and 90 mg/dL . -Patient is warned not to take insulin without proper monitoring per orders. - The DEXCOM would have helped him , but does not want to wear a sensor. I try to explain yet another device of continuous glucose monitoring, Libre, which he Heinze declines.  -Patient is encouraged to call clinic for blood glucose levels less than 70 or above 300 mg /dl.  -Due to his renal hx , he is not a suitable candidate for metformin, SGLT2 inhibitors, and declined DPPV inhibitors.  - Patient specific target  for A1c; LDL, HDL, Triglycerides, and  Waist Circumference were discussed in detail.  2) BP/HTN: uncontrolled. Continue current medications including ACEI/ARB. 3) Lipids/HPL: Controlled, during his last labs LDL  74, he decided to stop pravastatin after reading in the Internet about sinus infections from statins.  I explained statins unlikely to cause sinus infections and his case, he wouldn't reconsider it at this time. 4)  Weight/Diet: CDE consult in progress, exercise, and carbohydrates information provided.  5) Chronic Care/Health Maintenance:  -Patient is on ACEI/ARB and Statin medications and encouraged to continue to follow up with Ophthalmology, Podiatrist at least yearly or according to recommendations, and advised to  stay away from smoking. I have recommended yearly flu vaccine and pneumonia vaccination at least every 5 years; moderate intensity exercise for up to 150 minutes weekly; and  sleep for at least 7 hours a day.  - Time spent with the patient: 25 min, of which >50% was spent in reviewing his sugar logs , discussing his hypo- and hyper-glycemic episodes, reviewing  previous labs and insulin doses and developing a plan to avoid hypo- and  hyper-glycemia.   - I advised patient to maintain close follow up with Earney Mallet, MD for primary care needs.  Patient is asked to bring meter and  blood glucose logs during their next visit.   Follow up plan: -Return in about 3 months (around 08/31/2017) for meter, and logs.  Gebre , MD Phone: [phone]  Fax: [phone]   05/31/2017, 10:12 AM 

## 2017-05-31 NOTE — Patient Instructions (Signed)

## 2017-09-10 LAB — HEMOGLOBIN A1C
EAG (MMOL/L): 9.2 (calc)
HEMOGLOBIN A1C: 7.4 %{Hb} — AB (ref <5.7)
MEAN PLASMA GLUCOSE: 166 (calc)

## 2017-09-10 LAB — MICROALBUMIN / CREATININE URINE RATIO
Creatinine, Urine: 30 mg/dL (ref 20–320)
MICROALB/CREAT RATIO: 80 ug/mg{creat} — AB (ref <30)
Microalb, Ur: 2.4 mg/dL

## 2017-09-10 LAB — T4, FREE: Free T4: 1 ng/dL (ref 0.8–1.8)

## 2017-09-10 LAB — RENAL FUNCTION PANEL
Albumin: 4.3 g/dL (ref 3.6–5.1)
BUN: 19 mg/dL (ref 7–25)
CALCIUM: 10.1 mg/dL (ref 8.6–10.3)
CO2: 29 mmol/L (ref 20–32)
CREATININE: 1.18 mg/dL (ref 0.70–1.18)
Chloride: 101 mmol/L (ref 98–110)
Glucose, Bld: 265 mg/dL — ABNORMAL HIGH (ref 65–139)
Phosphorus: 3.2 mg/dL (ref 2.1–4.3)
Potassium: 4.5 mmol/L (ref 3.5–5.3)
SODIUM: 134 mmol/L — AB (ref 135–146)

## 2017-09-10 LAB — TSH: TSH: 1.69 m[IU]/L (ref 0.40–4.50)

## 2017-09-16 ENCOUNTER — Ambulatory Visit: Payer: Medicare FFS | Admitting: "Endocrinology

## 2017-09-16 ENCOUNTER — Encounter: Payer: Self-pay | Admitting: "Endocrinology

## 2017-09-16 VITALS — BP 144/87 | HR 65 | Ht 71.0 in | Wt 214.0 lb

## 2017-09-16 DIAGNOSIS — E1165 Type 2 diabetes mellitus with hyperglycemia: Secondary | ICD-10-CM | POA: Diagnosis not present

## 2017-09-16 DIAGNOSIS — I10 Essential (primary) hypertension: Secondary | ICD-10-CM | POA: Diagnosis not present

## 2017-09-16 DIAGNOSIS — IMO0002 Reserved for concepts with insufficient information to code with codable children: Secondary | ICD-10-CM

## 2017-09-16 DIAGNOSIS — Z794 Long term (current) use of insulin: Secondary | ICD-10-CM | POA: Diagnosis not present

## 2017-09-16 DIAGNOSIS — E782 Mixed hyperlipidemia: Secondary | ICD-10-CM | POA: Diagnosis not present

## 2017-09-16 DIAGNOSIS — E118 Type 2 diabetes mellitus with unspecified complications: Secondary | ICD-10-CM

## 2017-09-16 NOTE — Progress Notes (Signed)
Subjective:    Patient ID: Noah Daniel, male    DOB: 1941-06-15, PCP Earney Mallet, MD   Past Medical History:  Diagnosis Date  . BPH (benign prostatic hyperplasia)   . CKD (chronic kidney disease)   . Diabetes mellitus, type II ([place])   . Diabetic neuropathy ([place])   . History of kidney cancer   . Hyperlipidemia   . Hypertension   . Osteoarthritis    Past Surgical History:  Procedure Laterality Date  . CARPAL TUNNEL RELEASE    . CATARACT EXTRACTION    . NEPHRECTOMY     Social History   Socioeconomic History  . Marital status: Unknown    Spouse name: None  . Number of children: None  . Years of education: None  . Highest education level: None  Social Needs  . Financial resource strain: None  . Food insecurity - worry: None  . Food insecurity - inability: None  . Transportation needs - medical: None  . Transportation needs - non-medical: None  Occupational History  . None  Tobacco Use  . Smoking status: Former Research scientist (life sciences)  . Smokeless tobacco: Never Used  Substance and Sexual Activity  . Alcohol use: No    Alcohol/week: 0.0 oz  . Drug use: No  . Sexual activity: None  Other Topics Concern  . None  Social History Narrative  . None   Outpatient Encounter Medications as of 09/16/2017  Medication Sig  . amLODipine (NORVASC) 5 MG tablet Take 5 mg by mouth daily.  Marland Kitchen aspirin 81 MG tablet Take 81 mg by mouth daily.  . blood glucose meter kit and supplies KIT Dispense based on patient and insurance preference. Use up to four times daily as directed. (FOR ICD-10 E11.65)  . Blood Glucose Monitoring Suppl (TRUE METRIX METER) w/Device KIT USE TO TEST BLOOD SUGAR UP TO 4 TIMES A DAY AS DIRECTED  . insulin aspart (NOVOLOG) 100 UNIT/ML injection Inject 12-18 Units into the skin 3 (three) times daily with meals.  . insulin glargine (LANTUS) 100 UNIT/ML injection INJECT 30 UNITS SUBCUTANEOUSLY AT BEDTIME  . Insulin Syringe-Needle U-100 (RELION INSULIN SYR .3CC/29G) 29G X  1/2" 0.3 ML MISC Use to inject insulin 4 times a day.  . losartan-hydrochlorothiazide (HYZAAR) 50-12.5 MG tablet Take 1 tablet by mouth daily.  . TRUE METRIX BLOOD GLUCOSE TEST test strip USE TO TEST BLOOD SUGAR UP TO 4 TIMES A DAY AS DIRECTED  . Vitamin D, Cholecalciferol, 1000 UNITS CAPS Take by mouth daily.   No facility-administered encounter medications on file as of 09/16/2017.    ALLERGIES: No Known Allergies VACCINATION STATUS:  There is no immunization history on file for this patient.  Diabetes  He presents for his follow-up diabetic visit. He has type 2 diabetes mellitus. Onset time: He was diagnosed at approximate age of 37 years. His disease course has been improving. There are no hypoglycemic associated symptoms. Pertinent negatives for hypoglycemia include no confusion, headaches, pallor or seizures. Associated symptoms include fatigue. Pertinent negatives for diabetes include no chest pain, no polydipsia, no polyphagia, no polyuria and no weakness. There are no hypoglycemic complications. Symptoms are improving. Diabetic complications include peripheral neuropathy. 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 Current diabetic treatment includes intensive insulin program. He is compliant with treatment most of the time. His weight is increasing steadily. He is following a generally unhealthy diet. He has not  had a previous visit with a dietitian. He participates in exercise intermittently. His home blood glucose trend is fluctuating minimally (He [place] gets some random hypoglycemia but at much less frequency than before.). His breakfast blood glucose range is generally 140-180 mg/dl. His lunch blood glucose range is generally 140-180 mg/dl. His dinner blood glucose range is generally 140-180 mg/dl. His overall blood glucose range is  140-180 mg/dl. An ACE inhibitor/angiotensin II receptor blocker is being taken. Eye exam is current.  Hyperlipidemia  This is a chronic problem. The current episode started more than 1 year ago. Pertinent negatives include no chest pain, myalgias or shortness of breath. Current antihyperlipidemic treatment includes statins. Risk factors for coronary artery disease include dyslipidemia, diabetes mellitus, hypertension, male sex and obesity.  Hypertension  This is a chronic problem. The current episode started more than 1 year ago. The problem is uncontrolled. Pertinent negatives include no chest pain, headaches, neck pain, palpitations or shortness of breath. Risk factors for coronary artery disease include diabetes mellitus, dyslipidemia, male gender, obesity and smoking/tobacco exposure. Past treatments include angiotensin blockers and diuretics.    Review of Systems  Constitutional: Positive for fatigue. Negative for unexpected weight change.  HENT: Negative for dental problem, mouth sores and trouble swallowing.   Eyes: Negative for visual disturbance.  Respiratory: Negative for cough, choking, chest tightness, shortness of breath and wheezing.   Cardiovascular: Negative for chest pain, palpitations and leg swelling.  Gastrointestinal: Negative for abdominal distention, abdominal pain, constipation, diarrhea, nausea and vomiting.  Endocrine: Negative for polydipsia, polyphagia and polyuria.  Genitourinary: Negative for dysuria, flank pain, hematuria and urgency.  Musculoskeletal: Negative for back pain, gait problem, myalgias and neck pain.  Skin: Negative for pallor, rash and wound.  Neurological: Negative for seizures, syncope, weakness, numbness and headaches.  Psychiatric/Behavioral: Negative.  Negative for confusion and dysphoric mood.    Objective:    BP (!) 144/87   Pulse 65   Ht '5\' 11"'  (1.803 m)   Wt 214 lb (97.1 kg)   BMI 29.85 kg/m   Wt Readings from Last 3 Encounters:   09/16/17 214 lb (97.1 kg)  05/31/17 206 lb (93.4 kg)  01/09/17 208 lb (94.3 kg)    Physical Exam  Constitutional: He is oriented to person, place, and time. He appears well-developed and well-nourished. He is cooperative. No distress.  HENT:  Head: Normocephalic and atraumatic.  Eyes: EOM are normal.  Neck: Normal range of motion. Neck supple. No tracheal deviation present. No thyromegaly present.  Cardiovascular: Normal rate, S1 normal, S2 normal and normal heart sounds. Exam reveals no gallop.  No murmur heard. Pulses:      Dorsalis pedis pulses are 1+ on the right side, and 1+ on the left side.       Posterior tibial pulses are 1+ on the right side, and 1+ on the left side.  Pulmonary/Chest: Breath sounds normal. No respiratory distress. He has no wheezes.  Abdominal: Soft. Bowel sounds are normal. He exhibits no distension. There is no tenderness. There is no guarding and no CVA tenderness.  Musculoskeletal: He exhibits no edema.       Right shoulder: He exhibits no swelling and no deformity.  Neurological: He is alert and oriented to person, place, and time. He has normal strength and normal reflexes. No cranial nerve deficit or sensory deficit. Gait normal.  Skin: Skin is warm and dry. No rash noted. No cyanosis. Nails show no clubbing.  Psychiatric: He has a normal mood and affect.  His speech is normal and behavior is normal. Judgment and thought content normal. Cognition and memory are normal.   Recent Results (from the past 2160 hour(s))  Renal function panel     Status: Abnormal   Collection Time: 09/09/17 10:58 AM  Result Value Ref Range   Glucose, Bld 265 (H) 65 - 139 mg/dL    Comment: .        Non-fasting reference interval .    BUN 19 7 - 25 mg/dL   Creat 1.18 0.70 - 1.18 mg/dL    Comment: For patients >40 years of age, the reference limit for Creatinine is approximately 13% higher for people identified as African-American. .    BUN/Creatinine Ratio NOT  APPLICABLE 6 - 22 (calc)   Sodium 134 (L) 135 - 146 mmol/L   Potassium 4.5 3.5 - 5.3 mmol/L   Chloride 101 98 - 110 mmol/L   CO2 29 20 - 32 mmol/L   Calcium 10.1 8.6 - 10.3 mg/dL   Phosphorus 3.2 2.1 - 4.3 mg/dL   Albumin 4.3 3.6 - 5.1 g/dL  Hemoglobin A1c     Status: Abnormal   Collection Time: 09/09/17 10:58 AM  Result Value Ref Range   Hgb A1c MFr Bld 7.4 (H) <5.7 % of total Hgb    Comment: For someone without known diabetes, a hemoglobin A1c value of 6.5% or greater indicates that they may have  diabetes and this should be confirmed with a follow-up  test. . For someone with known diabetes, a value <7% indicates  that their diabetes is well controlled and a value  greater than or equal to 7% indicates suboptimal  control. A1c targets should be individualized based on  duration of diabetes, age, comorbid conditions, and  other considerations. . Currently, no consensus exists regarding use of hemoglobin A1c for diagnosis of diabetes for children. .    Mean Plasma Glucose 166 (calc)   eAG (mmol/L) 9.2 (calc)  TSH     Status: None   Collection Time: 09/09/17 10:58 AM  Result Value Ref Range   TSH 1.69 0.40 - 4.50 mIU/L  T4, free     Status: None   Collection Time: 09/09/17 10:58 AM  Result Value Ref Range   Free T4 1.0 0.8 - 1.8 ng/dL  Microalbumin / creatinine urine ratio     Status: Abnormal   Collection Time: 09/09/17 10:58 AM  Result Value Ref Range   Creatinine, Urine 30 20 - 320 mg/dL   Microalb, Ur 2.4 mg/dL    Comment: Reference Range Not established    Microalb Creat Ratio 80 (H) <30 mcg/mg creat    Comment: . The ADA defines abnormalities in albumin excretion as follows: Marland Kitchen Category         Result (mcg/mg creatinine) . Normal                    <30 Microalbuminuria         30-299  Clinical albuminuria   > OR = 300 . The ADA recommends that at least two of three specimens collected within a 3-6 month period be abnormal before considering a patient to  be within a diagnostic category.      Assessment & Plan:   1. Uncontrolled type 2 diabetes mellitus with complication, with long-term current use of insulin ([place])  - Patient remains at a high risk for more acute and chronic complications of diabetes which include CAD, CVA, CKD, retinopathy, and neuropathy. These are all  discussed in detail with the patient.  Patient came with  [place] fluctuating but better  glucose profile, and  recent A1c of   7.4% , generally improving from 8.4% .  -   He insists that he has to control diabetes to a1c of 6%. I discussed with him safe target for his a1c is 7.5-8%. He is reluctant.   - Glucose logs and insulin administration records pertaining to this visit,  to be scanned into patient's records.  Recent labs reviewed.   - I have re-counseled the patient on diet management and weight loss  by adopting a carbohydrate restricted / protein rich  Diet.  -  Suggestion is made for him to avoid simple carbohydrates  from his diet including Cakes, Sweet Desserts / Pastries, Ice Cream, Soda (diet and regular), Sweet Tea, Candies, Chips, Cookies, Store Bought Juices, Alcohol in Excess of  1-2 drinks a day, Artificial Sweeteners, and "Sugar-free" Products. This will help patient to have stable blood glucose profile and potentially avoid unintended weight gain.  - Patient is advised to stick to a routine mealtimes to eat 3 meals  a day and avoid unnecessary snacks ( to snack only to correct hypoglycemia).  - I have approached patient with the following individualized plan to manage diabetes and patient agrees.  -I will continue Lantus  30 units qhs, continue NovoLog 12 units 3 times a day before meals plus correction, when pre-meal blood glucoses above 70 mg/dL, associated with monitoring of blood glucose 4 times a day-before meals and at bedtime.  - continuous glucose monitoring would have been helpful in his care, however, he insists he does not want to wear a  sensor. -Patient is warned not to take insulin without proper monitoring per orders.  -Patient is encouraged to call clinic for blood glucose levels less than 70 or above 300 mg /dl.  -Due to his renal hx , he is not a suitable candidate for metformin, SGLT2 inhibitors, and declined DPPV inhibitors.  - Patient specific target  for A1c; LDL, HDL, Triglycerides, and  Waist Circumference were discussed in detail.  2) BP/HTN: uncontrolled. Continue current medications including ACEI/ARB. 3) Lipids/HPL: Controlled, during his last labs LDL  74, he decided to stop pravastatin after reading in the Internet about sinus infections from statins.  I explained statins unlikely to cause sinus infections , he wouldn't reconsider it at this time. 4)  Weight/Diet: CDE consult in progress, exercise, and carbohydrates information provided.  5) Chronic Care/Health Maintenance:  -Patient is on ACEI/ARB and Statin medications and encouraged to continue to follow up with Ophthalmology, Podiatrist at least yearly or according to recommendations, and advised to  stay away from smoking. I have recommended yearly flu vaccine and pneumonia vaccination at least every 5 years; moderate intensity exercise for up to 150 minutes weekly; and  sleep for at least 7 hours a day.  - Time spent with the patient: 25 min, of which >50% was spent in reviewing his sugar logs , discussing his hypo- and hyper-glycemic episodes, reviewing his current and  previous labs and insulin doses and developing a plan to avoid hypo- and hyper-glycemia.     - I advised patient to maintain close follow up with Earney Mallet, MD for primary care needs.  Follow up plan: -Return in about 3 months (around 12/15/2017) for meter, and logs.  Glade Lloyd, MD Phone: [phone]  Fax: [phone]   -  This note was partially dictated with voice recognition software. Similar sounding words  can be transcribed inadequately or may not  be corrected upon  review.  09/16/2017, 10:30 AM

## 2017-10-13 ENCOUNTER — Other Ambulatory Visit: Payer: Self-pay | Admitting: "Endocrinology

## 2017-10-28 ENCOUNTER — Other Ambulatory Visit: Payer: Self-pay | Admitting: "Endocrinology

## 2017-11-07 ENCOUNTER — Other Ambulatory Visit: Payer: Self-pay | Admitting: "Endocrinology

## 2017-12-19 ENCOUNTER — Encounter: Payer: Self-pay | Admitting: "Endocrinology

## 2017-12-19 ENCOUNTER — Ambulatory Visit: Payer: Medicare FFS | Admitting: "Endocrinology

## 2017-12-19 VITALS — BP 138/80 | HR 69 | Ht 71.0 in | Wt 209.0 lb

## 2017-12-19 DIAGNOSIS — E782 Mixed hyperlipidemia: Secondary | ICD-10-CM | POA: Diagnosis not present

## 2017-12-19 DIAGNOSIS — I10 Essential (primary) hypertension: Secondary | ICD-10-CM

## 2017-12-19 DIAGNOSIS — E1165 Type 2 diabetes mellitus with hyperglycemia: Secondary | ICD-10-CM | POA: Diagnosis not present

## 2017-12-19 LAB — COMPLETE METABOLIC PANEL WITH GFR
AG Ratio: 2.3 (calc) (ref 1.0–2.5)
ALBUMIN MSPROF: 4.1 g/dL (ref 3.6–5.1)
ALT: 21 U/L (ref 9–46)
AST: 17 U/L (ref 10–35)
Alkaline phosphatase (APISO): 94 U/L (ref 40–115)
BUN / CREAT RATIO: 23 (calc) — AB (ref 6–22)
BUN: 30 mg/dL — AB (ref 7–25)
CO2: 27 mmol/L (ref 20–32)
CREATININE: 1.3 mg/dL — AB (ref 0.70–1.18)
Calcium: 9.9 mg/dL (ref 8.6–10.3)
Chloride: 95 mmol/L — ABNORMAL LOW (ref 98–110)
GFR, Est African American: 61 mL/min/{1.73_m2} (ref >=60)
GFR, Est Non African American: 52 mL/min/{1.73_m2} — ABNORMAL LOW (ref >=60)
GLUCOSE: 439 mg/dL — AB (ref 65–139)
Globulin: 1.8 g/dL (calc) — ABNORMAL LOW (ref 1.9–3.7)
Potassium: 4.8 mmol/L (ref 3.5–5.3)
Sodium: 128 mmol/L — ABNORMAL LOW (ref 135–146)
Total Bilirubin: 1.5 mg/dL — ABNORMAL HIGH (ref 0.2–1.2)
Total Protein: 5.9 g/dL — ABNORMAL LOW (ref 6.1–8.1)

## 2017-12-19 LAB — HEMOGLOBIN A1C
Hgb A1c MFr Bld: 7.8 % of total Hgb — ABNORMAL HIGH (ref <5.7)
Mean Plasma Glucose: 177 (calc)
eAG (mmol/L): 9.8 (calc)

## 2017-12-19 LAB — VITAMIN D 25 HYDROXY (VIT D DEFICIENCY, FRACTURES): Vit D, 25-Hydroxy: 35 ng/mL (ref 30–100)

## 2017-12-19 LAB — GLUCOSE, POCT (MANUAL RESULT ENTRY): POC Glucose: 304 mg/dl — AB (ref 70–99)

## 2017-12-19 NOTE — Progress Notes (Signed)
Subjective:    Patient ID: Noah Daniel, male    DOB: Oct 04, 1941, PCP Noah Mallet, MD   Past Medical History:  Diagnosis Date  . BPH (benign prostatic hyperplasia)   . CKD (chronic kidney disease)   . Diabetes mellitus, type II ([place])   . Diabetic neuropathy ([place])   . History of kidney cancer   . Hyperlipidemia   . Hypertension   . Osteoarthritis    Past Surgical History:  Procedure Laterality Date  . CARPAL TUNNEL RELEASE    . CATARACT EXTRACTION    . NEPHRECTOMY     Social History   Socioeconomic History  . Marital status: Unknown    Spouse name: None  . Number of children: None  . Years of education: None  . Highest education level: None  Social Needs  . Financial resource strain: None  . Food insecurity - worry: None  . Food insecurity - inability: None  . Transportation needs - medical: None  . Transportation needs - non-medical: None  Occupational History  . None  Tobacco Use  . Smoking status: Former Research scientist (life sciences)  . Smokeless tobacco: Never Used  Substance and Sexual Activity  . Alcohol use: No    Alcohol/week: 0.0 oz  . Drug use: No  . Sexual activity: None  Other Topics Concern  . None  Social History Narrative  . None   Outpatient Encounter Medications as of 12/19/2017  Medication Sig  . amLODipine (NORVASC) 10 MG tablet Take 10 mg by mouth daily.   Marland Kitchen aspirin 81 MG tablet Take 81 mg by mouth daily.  . insulin aspart (NOVOLOG) 100 UNIT/ML injection Inject 10-16 Units into the skin 3 (three) times daily before meals.  . insulin glargine (LANTUS) 100 UNIT/ML injection INJECT 30 UNITS SUBCUTANEOUSLY AT BEDTIME (Patient taking differently: Inject 24 Units into the skin at bedtime. INJECT 30 UNITS SUBCUTANEOUSLY AT BEDTIME)  . losartan-hydrochlorothiazide (HYZAAR) 100-25 MG tablet Take 1 tablet by mouth daily.  . Omega-3 Fatty Acids (FISH OIL) 1200 MG CAPS Take by mouth daily.  . pravastatin (PRAVACHOL) 20 MG tablet Take 20 mg by mouth.  . Vitamin D,  Cholecalciferol, 1000 UNITS CAPS Take by mouth daily.  . blood glucose meter kit and supplies KIT Dispense based on patient and insurance preference. Use up to four times daily as directed. (FOR ICD-10 E11.65)  . Blood Glucose Monitoring Suppl (TRUE METRIX METER) w/Device KIT USE TO TEST BLOOD SUGAR UP TO 4 TIMES A DAY AS DIRECTED  . Insulin Syringe-Needle U-100 (RELION INSULIN SYR .3CC/29G) 29G X 1/2" 0.3 ML MISC Use to inject insulin 4 times a day.  Marland Kitchen RELION INSULIN SYR 0.5ML/31G 31G X 5/16" 0.5 ML MISC USE 1 SYRINGE 4 TIMES DAILY  . TRUE METRIX BLOOD GLUCOSE TEST test strip USE TO TEST BLOOD SUGAR UP TO 4 TIMES DAILY AS DIRECTED  . [DISCONTINUED] NOVOLOG 100 UNIT/ML injection INJECT 12-18 UNITS INTO THE SKIN 3 (THREE) TIMES DAILY WITH MEALS. DISCARD OPENED VIAL AFTER 28 DAYS   No facility-administered encounter medications on file as of 12/19/2017.    ALLERGIES: No Known Allergies VACCINATION STATUS:  There is no immunization history on file for this patient.  Diabetes  He presents for his follow-up diabetic visit. He has type 2 diabetes mellitus. Onset time: He was diagnosed at approximate age of 62 years. His disease course has been worsening. There are no hypoglycemic associated symptoms. Pertinent negatives for hypoglycemia include no confusion, headaches, pallor or seizures. Associated symptoms include fatigue.  Pertinent negatives for diabetes include no chest pain, no polydipsia, no polyphagia, no polyuria and no weakness. There are no hypoglycemic complications. Symptoms are worsening. Diabetic complications include peripheral neuropathy. 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 Current diabetic treatment includes intensive insulin program. He is compliant with treatment most of the time. His weight is decreasing steadily. He is  following a generally unhealthy diet. He has not had a previous visit with a dietitian. He participates in exercise intermittently. His home blood glucose trend is fluctuating minimally (He Noah Daniel gets some random hypoglycemia but at much less frequency than before.). His breakfast blood glucose range is generally 180-200 mg/dl. His lunch blood glucose range is generally 180-200 mg/dl. His dinner blood glucose range is generally 180-200 mg/dl. His overall blood glucose range is 180-200 mg/dl. (He came with significantly fluctuating blood glucose profile including some rare random hypoglycemia.  He has changed his Lantus since last visit to 24 units nightly.) An ACE inhibitor/angiotensin II receptor blocker is being taken. Eye exam is current.  Hyperlipidemia  This is a chronic problem. The current episode started more than 1 year ago. Exacerbating diseases include diabetes. Pertinent negatives include no chest pain, myalgias or shortness of breath. Current antihyperlipidemic treatment includes statins. Risk factors for coronary artery disease include dyslipidemia, diabetes mellitus, hypertension, male sex and obesity.  Hypertension  This is a chronic problem. The current episode started more than 1 year ago. The problem is uncontrolled. Pertinent negatives include no chest pain, headaches, neck pain, palpitations or shortness of breath. Risk factors for coronary artery disease include diabetes mellitus, dyslipidemia, male gender, obesity and smoking/tobacco exposure. Past treatments include angiotensin blockers and diuretics.    Review of Systems  Constitutional: Positive for fatigue. Negative for unexpected weight change.  HENT: Negative for dental problem, mouth sores and trouble swallowing.   Eyes: Negative for visual disturbance.  Respiratory: Negative for cough, choking, chest tightness, shortness of breath and wheezing.   Cardiovascular: Negative for chest pain, palpitations and leg swelling.   Gastrointestinal: Negative for abdominal distention, abdominal pain, constipation, diarrhea, nausea and vomiting.  Endocrine: Negative for polydipsia, polyphagia and polyuria.  Genitourinary: Negative for dysuria, flank pain, hematuria and urgency.  Musculoskeletal: Negative for back pain, gait problem, myalgias and neck pain.  Skin: Negative for pallor, rash and wound.  Neurological: Negative for seizures, syncope, weakness, numbness and headaches.  Psychiatric/Behavioral: Negative.  Negative for confusion and dysphoric mood.    Objective:    BP 138/80   Pulse 69   Ht '5\' 11"'  (1.803 m)   Wt 209 lb (94.8 kg)   BMI 29.15 kg/m   Wt Readings from Last 3 Encounters:  12/19/17 209 lb (94.8 kg)  09/16/17 214 lb (97.1 kg)  05/31/17 206 lb (93.4 kg)    Physical Exam  Constitutional: He is oriented to person, place, and time. He appears well-developed and well-nourished. He is cooperative. No distress.  HENT:  Head: Normocephalic and atraumatic.  Eyes: EOM are normal.  Neck: Normal range of motion. Neck supple. No tracheal deviation present. No thyromegaly present.  Cardiovascular: Normal rate, S1 normal, S2 normal and normal heart sounds. Exam reveals no gallop.  No murmur heard. Pulses:      Dorsalis pedis pulses are 1+ on the right side, and 1+ on the left side.       Posterior tibial pulses are 1+ on the right side,  and 1+ on the left side.  Pulmonary/Chest: Breath sounds normal. No respiratory distress. He has no wheezes.  Abdominal: Soft. Bowel sounds are normal. He exhibits no distension. There is no tenderness. There is no guarding and no CVA tenderness.  Musculoskeletal: He exhibits no edema.       Right shoulder: He exhibits no swelling and no deformity.  Neurological: He is alert and oriented to person, place, and time. He has normal strength and normal reflexes. No cranial nerve deficit or sensory deficit. Gait normal.  Skin: Skin is warm and dry. No rash noted. No cyanosis.  Nails show no clubbing.  Psychiatric: He has a normal mood and affect. His speech is normal and behavior is normal. Judgment and thought content normal. Cognition and memory are normal.   Recent Results (from the past 2160 hour(s))  COMPLETE METABOLIC PANEL WITH GFR     Status: Abnormal   Collection Time: 12/18/17  9:50 AM  Result Value Ref Range   Glucose, Bld 439 (H) 65 - 139 mg/dL    Comment: .        Non-fasting reference interval .    BUN 30 (H) 7 - 25 mg/dL   Creat 1.30 (H) 0.70 - 1.18 mg/dL    Comment: For patients >67 years of age, the reference limit for Creatinine is approximately 13% higher for people identified as African-American. .    GFR, Est Non African American 52 (L) > OR = 60 mL/min/1.25m   GFR, Est African American 61 > OR = 60 mL/min/1.739m  BUN/Creatinine Ratio 23 (H) 6 - 22 (calc)   Sodium 128 (L) 135 - 146 mmol/L   Potassium 4.8 3.5 - 5.3 mmol/L   Chloride 95 (L) 98 - 110 mmol/L   CO2 27 20 - 32 mmol/L   Calcium 9.9 8.6 - 10.3 mg/dL   Total Protein 5.9 (L) 6.1 - 8.1 g/dL   Albumin 4.1 3.6 - 5.1 g/dL   Globulin 1.8 (L) 1.9 - 3.7 g/dL (calc)   AG Ratio 2.3 1.0 - 2.5 (calc)   Total Bilirubin 1.5 (H) 0.2 - 1.2 mg/dL   Alkaline phosphatase (APISO) 94 40 - 115 U/L   AST 17 10 - 35 U/L   ALT 21 9 - 46 U/L  Hemoglobin A1c     Status: Abnormal   Collection Time: 12/18/17  9:50 AM  Result Value Ref Range   Hgb A1c MFr Bld 7.8 (H) <5.7 % of total Hgb    Comment: For someone without known diabetes, a hemoglobin A1c value of 6.5% or greater indicates that they may have  diabetes and this should be confirmed with a follow-up  test. . For someone with known diabetes, a value <7% indicates  that their diabetes is well controlled and a value  greater than or equal to 7% indicates suboptimal  control. A1c targets should be individualized based on  duration of diabetes, age, comorbid conditions, and  other considerations. . Currently, no consensus exists  regarding use of hemoglobin A1c for diagnosis of diabetes for children. .    Mean Plasma Glucose 177 (calc)   eAG (mmol/L) 9.8 (calc)  VITAMIN D 25 Hydroxy (Vit-D Deficiency, Fractures)     Status: None   Collection Time: 12/18/17  9:50 AM  Result Value Ref Range   Vit D, 25-Hydroxy 35 30 - 100 ng/mL    Comment: Vitamin D Status         25-OH Vitamin D: . Deficiency:                    <  20 ng/mL Insufficiency:             20 - 29 ng/mL Optimal:                 > or = 30 ng/mL . For 25-OH Vitamin D testing on patients on  D2-supplementation and patients for whom quantitation  of D2 and D3 fractions is required, the QuestAssureD(TM) 25-OH VIT D, (D2,D3), LC/MS/MS is recommended: order  code [phone] (patients >4yr). . For more information on this test, go to: http://education.questdiagnostics.com/faq/FAQ163 (This link is being provided for  informational/educational purposes only.)   POCT glucose (manual entry)     Status: Abnormal   Collection Time: 12/19/17 11:35 AM  Result Value Ref Range   POC Glucose 304 (A) 70 - 99 mg/dl    Comment: 3 hrs PC     Assessment & Plan:   1. Uncontrolled type 2 diabetes mellitus with complication, with long-term [place] use of insulin [place]  - Patient remains at a high risk for more acute and chronic complications of diabetes which include CAD, CVA, CKD, retinopathy, and neuropathy. These are all discussed in detail with the patient.  Patient came with  [place] fluctuating,   glucose profile, with some rare and random hypoglycemia.  His A1c is 7.8% compared to 7.4% last visit.  Generally improving from 8.4%.  -   He insists that he has to control diabetes to a1c of 6%. I discussed with him safe target for his a1c is 7.5-8%. He is reluctant.   - Glucose logs and insulin administration records pertaining to this visit,  to be scanned into patient's records.  Recent labs reviewed.   - I have re-counseled the patient on diet management and  weight loss  by adopting a carbohydrate restricted / protein rich  Diet.  -  Suggestion is made for him to avoid simple carbohydrates  from his diet including Cakes, Sweet Desserts / Pastries, Ice Cream, Soda (diet and regular), Sweet Tea, Candies, Chips, Cookies, Store Bought Juices, Alcohol in Excess of  1-2 drinks a day, Artificial Sweeteners, and "Sugar-free" Products. This will help patient to have stable blood glucose profile and potentially avoid unintended weight gain.   - Patient is advised to stick to a routine mealtimes to eat 3 meals  a day and avoid unnecessary snacks ( to snack only to correct hypoglycemia).  - I have approached patient with the following individualized plan to manage diabetes and patient agrees.  -Patient is elderly who lives alone with no support at home.   -#1 goal of treatment in his case would be to avoid hypoglycemia.   -I discussed this concern with him and continued lower dose of Lantus 24 units nightly, lower NovoLog to 10 units  3 times a day before meals plus correction, when pre-meal blood glucoses above 70 mg/dL, associated with monitoring of blood glucose 4 times a day-before meals and at bedtime.  -In his situation, continuous glucose monitoring would have been helpful in his care, however, he insists he does not want to wear a sensor. -He was reapproached with the idea of the CGM, states that he will read more about it and call back.  I gave him information about the [facility]. -Patient is warned not to take insulin without proper monitoring per orders.  -Patient is encouraged to call clinic for blood glucose levels less than 70 or above 300 mg /dl.  -Due to his renal hx , he is not a suitable candidate  for metformin, SGLT2 inhibitors, and declined DPPV inhibitors.  - Patient specific target  for A1c; LDL, HDL, Triglycerides, and  Waist Circumference were discussed in detail.  2) BP/HTN: His blood pressure is controlled to target.    Continue current medications including ACEI/ARB. 3) Lipids/HPL: Controlled, during his last labs LDL  74, he decided to stop pravastatin after reading in the Internet about sinus infections from statins.  I explained statins unlikely to cause sinus infections , he wouldn't reconsider it at this time.  4)  Weight/Diet: CDE consult in progress, exercise, and carbohydrates information provided.  5) Chronic Care/Health Maintenance:  -Patient is on ACEI/ARB and Statin medications and encouraged to continue to follow up with Ophthalmology, Podiatrist at least yearly or according to recommendations, and advised to  stay away from smoking. I have recommended yearly flu vaccine and pneumonia vaccination at least every 5 years; moderate intensity exercise for up to 150 minutes weekly; and  sleep for at least 7 hours a day.   - Time spent with the patient: 25 min, of which >50% was spent in reviewing his blood glucose logs , discussing his hypo- and hyper-glycemic episodes, reviewing his current and  previous labs and insulin doses and developing a plan to avoid hypo- and hyper-glycemia. Please refer to Patient Instructions for Blood Glucose Monitoring and Insulin/Medications Dosing Guide"  in media tab for additional information.   - I advised patient to maintain close follow up with Noah Mallet, MD for primary care needs.  Follow up plan: -Return in about 4 months (around 04/20/2018) for follow up with pre-visit labs, meter, and logs.  Glade Lloyd, MD Phone: [phone]  Fax: [phone]   -  This note was partially dictated with voice recognition software. Similar sounding words can be transcribed inadequately or may not  be corrected upon review.  12/19/2017, 1:33 PM

## 2017-12-23 ENCOUNTER — Ambulatory Visit: Payer: Medicare FFS | Admitting: "Endocrinology

## 2017-12-26 ENCOUNTER — Ambulatory Visit: Payer: Medicare FFS | Admitting: "Endocrinology

## 2018-04-15 ENCOUNTER — Other Ambulatory Visit: Payer: Self-pay | Admitting: "Endocrinology

## 2018-04-16 LAB — COMPLETE METABOLIC PANEL WITH GFR
AG Ratio: 2.6 (calc) — ABNORMAL HIGH (ref 1.0–2.5)
ALBUMIN MSPROF: 4.1 g/dL (ref 3.6–5.1)
ALT: 17 U/L (ref 9–46)
AST: 18 U/L (ref 10–35)
Alkaline phosphatase (APISO): 94 U/L (ref 40–115)
BILIRUBIN TOTAL: 0.8 mg/dL (ref 0.2–1.2)
BUN: 14 mg/dL (ref 7–25)
CO2: 25 mmol/L (ref 20–32)
CREATININE: 1.09 mg/dL (ref 0.70–1.18)
Calcium: 10.2 mg/dL (ref 8.6–10.3)
Chloride: 103 mmol/L (ref 98–110)
GFR, EST AFRICAN AMERICAN: 75 mL/min/{1.73_m2} (ref >=60)
GFR, Est Non African American: 65 mL/min/{1.73_m2} (ref >=60)
Globulin: 1.6 g/dL (calc) — ABNORMAL LOW (ref 1.9–3.7)
Glucose, Bld: 186 mg/dL — ABNORMAL HIGH (ref 65–139)
Potassium: 4.2 mmol/L (ref 3.5–5.3)
Sodium: 136 mmol/L (ref 135–146)
TOTAL PROTEIN: 5.7 g/dL — AB (ref 6.1–8.1)

## 2018-04-16 LAB — HEMOGLOBIN A1C
Hgb A1c MFr Bld: 8.6 % of total Hgb — ABNORMAL HIGH (ref <5.7)
Mean Plasma Glucose: 200 (calc)
eAG (mmol/L): 11.1 (calc)

## 2018-04-23 ENCOUNTER — Ambulatory Visit: Payer: Medicare FFS | Admitting: "Endocrinology

## 2018-04-23 ENCOUNTER — Encounter: Payer: Self-pay | Admitting: "Endocrinology

## 2018-04-23 VITALS — BP 140/86 | HR 65 | Ht 71.0 in | Wt 210.0 lb

## 2018-04-23 DIAGNOSIS — I10 Essential (primary) hypertension: Secondary | ICD-10-CM

## 2018-04-23 DIAGNOSIS — E1165 Type 2 diabetes mellitus with hyperglycemia: Secondary | ICD-10-CM

## 2018-04-23 DIAGNOSIS — E782 Mixed hyperlipidemia: Secondary | ICD-10-CM

## 2018-04-23 MED ORDER — INSULIN GLARGINE 100 UNIT/ML ~~LOC~~ SOLN: SUBCUTANEOUS | 3 refills | Status: DC

## 2018-04-23 MED ORDER — INSULIN ASPART 100 UNIT/ML ~~LOC~~ SOLN: SUBCUTANEOUS | 2 refills | Status: DC

## 2018-04-23 NOTE — Progress Notes (Signed)
Subjective:    Patient ID: Noah Daniel, male    DOB: 1941-08-28, PCP Earney Mallet, MD   Past Medical History:  Diagnosis Date  . BPH (benign prostatic hyperplasia)   . CKD (chronic kidney disease)   . Diabetes mellitus, type II ([place])   . Diabetic neuropathy ([place])   . History of kidney cancer   . Hyperlipidemia   . Hypertension   . Osteoarthritis    Past Surgical History:  Procedure Laterality Date  . CARPAL TUNNEL RELEASE    . CATARACT EXTRACTION    . NEPHRECTOMY     Social History   Socioeconomic History  . Marital status: Unknown    Spouse name: Not on file  . Number of children: Not on file  . Years of education: Not on file  . Highest education level: Not on file  Occupational History  . Not on file  Social Needs  . Financial resource strain: Not on file  . Food insecurity:    Worry: Not on file    Inability: Not on file  . Transportation needs:    Medical: Not on file    Non-medical: Not on file  Tobacco Use  . Smoking status: Former Research scientist (life sciences)  . Smokeless tobacco: Never Used  Substance and Sexual Activity  . Alcohol use: No    Alcohol/week: 0.0 oz  . Drug use: No  . Sexual activity: Not on file  Lifestyle  . Physical activity:    Days per week: Not on file    Minutes per session: Not on file  . Stress: Not on file  Relationships  . Social connections:    Talks on phone: Not on file    Gets together: Not on file    Attends religious service: Not on file    Active member of club or organization: Not on file    Attends meetings of clubs or organizations: Not on file    Relationship status: Not on file  Other Topics Concern  . Not on file  Social History Narrative  . Not on file   Outpatient Encounter Medications as of 04/23/2018  Medication Sig  . amLODipine (NORVASC) 10 MG tablet Take 10 mg by mouth daily.   Marland Kitchen aspirin 81 MG tablet Take 81 mg by mouth daily.  . blood glucose meter kit and supplies KIT Dispense based on patient and  insurance preference. Use up to four times daily as directed. (FOR ICD-10 E11.65)  . Blood Glucose Monitoring Suppl (TRUE METRIX METER) w/Device KIT USE TO TEST BLOOD SUGAR UP TO 4 TIMES A DAY AS DIRECTED  . insulin aspart (NOVOLOG) 100 UNIT/ML injection Use up to 50 units daily as directed  . insulin glargine (LANTUS) 100 UNIT/ML injection INJECT 32 UNITS SUBCUTANEOUSLY AT BEDTIME  . Insulin Syringe-Needle U-100 (RELION INSULIN SYR .3CC/29G) 29G X 1/2" 0.3 ML MISC Use to inject insulin 4 times a day.  . losartan-hydrochlorothiazide (HYZAAR) 100-25 MG tablet Take 1 tablet by mouth daily.  . Omega-3 Fatty Acids (FISH OIL) 1200 MG CAPS Take by mouth daily.  . pravastatin (PRAVACHOL) 20 MG tablet Take 20 mg by mouth.  Daryll Brod INSULIN SYR 0.5ML/31G 31G X 5/16" 0.5 ML MISC USE 1 SYRINGE 4 TIMES DAILY  . TRUE METRIX BLOOD GLUCOSE TEST test strip USE TO TEST BLOOD SUGAR UP TO 4 TIMES DAILY AS DIRECTED  . Vitamin D, Cholecalciferol, 1000 UNITS CAPS Take by mouth daily.  . [DISCONTINUED] insulin aspart (NOVOLOG) 100 UNIT/ML injection Inject 10-16  Units into the skin 3 (three) times daily before meals.  . [DISCONTINUED] insulin glargine (LANTUS) 100 UNIT/ML injection INJECT 30 UNITS SUBCUTANEOUSLY AT BEDTIME (Patient taking differently: Inject 24 Units into the skin at bedtime. INJECT 30 UNITS SUBCUTANEOUSLY AT BEDTIME)   No facility-administered encounter medications on file as of 04/23/2018.    ALLERGIES: No Known Allergies VACCINATION STATUS:  There is no immunization history on file for this patient.  Diabetes  He presents for his follow-up diabetic visit. He has type 2 diabetes mellitus. Onset time: He was diagnosed at approximate age of 25 years. His disease course has been fluctuating. There are no hypoglycemic associated symptoms. Pertinent negatives for hypoglycemia include no confusion, headaches, pallor or seizures. Pertinent negatives for diabetes include no chest pain, no fatigue, no  polydipsia, no polyphagia, no polyuria and no weakness. There are no hypoglycemic complications. Symptoms are improving. Diabetic complications include peripheral neuropathy. 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 Current diabetic treatment includes intensive insulin program. He is compliant with treatment most of the time. His weight is fluctuating minimally. He is following a generally unhealthy diet. He has not had a previous visit with a dietitian. He participates in exercise intermittently. His home blood glucose trend is fluctuating minimally (He [place] gets some random hypoglycemia but at much less frequency than before.). His breakfast blood glucose range is generally 180-200 mg/dl. His lunch blood glucose range is generally 180-200 mg/dl. His dinner blood glucose range is generally 180-200 mg/dl. His overall blood glucose range is 180-200 mg/dl. (He came with above target overall glycemic profile, however much less frequent hypoglycemia.  His A1c is higher at 8.6%.   ) An ACE inhibitor/angiotensin II receptor blocker is being taken. Eye exam is current.  Hyperlipidemia  This is a chronic problem. The current episode started more than 1 year ago. Exacerbating diseases include diabetes. Pertinent negatives include no chest pain, myalgias or shortness of breath. Current antihyperlipidemic treatment includes statins. Risk factors for coronary artery disease include dyslipidemia, diabetes mellitus, hypertension, male sex and obesity.  Hypertension  This is a chronic problem. The current episode started more than 1 year ago. The problem is uncontrolled. Pertinent negatives include no chest pain, headaches, neck pain, palpitations or shortness of breath. Risk factors for coronary artery disease include diabetes mellitus, dyslipidemia, male gender,  obesity and smoking/tobacco exposure. Past treatments include angiotensin blockers and diuretics.    Review of Systems  Constitutional: Negative for fatigue and unexpected weight change.  HENT: Negative for dental problem, mouth sores and trouble swallowing.   Eyes: Negative for visual disturbance.  Respiratory: Negative for cough, choking, chest tightness, shortness of breath and wheezing.   Cardiovascular: Negative for chest pain, palpitations and leg swelling.  Gastrointestinal: Negative for abdominal distention, abdominal pain, constipation, diarrhea, nausea and vomiting.  Endocrine: Negative for polydipsia, polyphagia and polyuria.  Genitourinary: Negative for dysuria, flank pain, hematuria and urgency.  Musculoskeletal: Negative for back pain, gait problem, myalgias and neck pain.  Skin: Negative for pallor, rash and wound.  Neurological: Negative for seizures, syncope, weakness, numbness and headaches.  Psychiatric/Behavioral: Negative.  Negative for confusion and dysphoric mood.    Objective:    BP 140/86   Pulse 65   Ht '5\' 11"'  (1.803 m)   Wt 210 lb (95.3 kg)   BMI 29.29 kg/m   Wt Readings from Last 3 Encounters:  04/23/18 210 lb (95.3  kg)  12/19/17 209 lb (94.8 kg)  09/16/17 214 lb (97.1 kg)    Physical Exam  Constitutional: He is oriented to person, place, and time. He appears well-developed. He is cooperative. No distress.  HENT:  Head: Normocephalic and atraumatic.  Eyes: EOM are normal.  Neck: Normal range of motion. Neck supple. No tracheal deviation present. No thyromegaly present.  Cardiovascular: Normal rate, S1 normal and S2 normal. Exam reveals no gallop.  No murmur heard. Pulses:      Dorsalis pedis pulses are 1+ on the right side, and 1+ on the left side.       Posterior tibial pulses are 1+ on the right side, and 1+ on the left side.  Pulmonary/Chest: Effort normal. No respiratory distress. He has no wheezes.  Abdominal: He exhibits no distension.  There is no tenderness. There is no guarding and no CVA tenderness.  Musculoskeletal: He exhibits no edema.       Right shoulder: He exhibits no swelling and no deformity.  Neurological: He is alert and oriented to person, place, and time. He has normal strength. No cranial nerve deficit or sensory deficit. Gait normal.  Skin: Skin is warm and dry. No rash noted. No cyanosis. Nails show no clubbing.  Psychiatric: He has a normal mood and affect. His speech is normal. Judgment normal. Cognition and memory are normal.   Recent Results (from the past 2160 hour(s))  COMPLETE METABOLIC PANEL WITH GFR     Status: Abnormal   Collection Time: 04/15/18 11:18 AM  Result Value Ref Range   Glucose, Bld 186 (H) 65 - 139 mg/dL    Comment: .        Non-fasting reference interval .    BUN 14 7 - 25 mg/dL   Creat 1.09 0.70 - 1.18 mg/dL    Comment: For patients >49 years of age, the reference limit for Creatinine is approximately 13% higher for people identified as African-American. .    GFR, Est Non African American 65 > OR = 60 mL/min/1.68m   GFR, Est African American 75 > OR = 60 mL/min/1.715m  BUN/Creatinine Ratio NOT APPLICABLE 6 - 22 (calc)   Sodium 136 135 - 146 mmol/L   Potassium 4.2 3.5 - 5.3 mmol/L   Chloride 103 98 - 110 mmol/L   CO2 25 20 - 32 mmol/L   Calcium 10.2 8.6 - 10.3 mg/dL   Total Protein 5.7 (L) 6.1 - 8.1 g/dL   Albumin 4.1 3.6 - 5.1 g/dL   Globulin 1.6 (L) 1.9 - 3.7 g/dL (calc)   AG Ratio 2.6 (H) 1.0 - 2.5 (calc)   Total Bilirubin 0.8 0.2 - 1.2 mg/dL   Alkaline phosphatase (APISO) 94 40 - 115 U/L   AST 18 10 - 35 U/L   ALT 17 9 - 46 U/L  Hemoglobin A1c     Status: Abnormal   Collection Time: 04/15/18 11:18 AM  Result Value Ref Range   Hgb A1c MFr Bld 8.6 (H) <5.7 % of total Hgb    Comment: For someone without known diabetes, a hemoglobin A1c value of 6.5% or greater indicates that they may have  diabetes and this should be confirmed with a follow-up  test. . For  someone with known diabetes, a value <7% indicates  that their diabetes is well controlled and a value  greater than or equal to 7% indicates suboptimal  control. A1c targets should be individualized based on  duration of diabetes, age, comorbid conditions, and  other considerations. . Currently, no consensus exists regarding use of hemoglobin A1c for diagnosis of diabetes for children. .    Mean Plasma Glucose 200 (calc)   eAG (mmol/L) 11.1 (calc)     Assessment & Plan:   1. Uncontrolled type 2 diabetes mellitus with complication, with long-term current use of insulin ([place])  - Patient remains at a high risk for more acute and chronic complications of diabetes which include CAD, CVA, CKD, retinopathy, and neuropathy. These are all discussed in detail with the patient.  Patient came with  [place] fluctuating, however save her glycemic profile.  His A1c is 8.6%, increasing from 7.8% during his last visit.  -   He insists that he has to control diabetes to a1c of 6%. I discussed with him safe target for his a1c is 7.5-8%. He is reluctant.   - Glucose logs and insulin administration records pertaining to this visit,  to be scanned into patient's records.  Recent labs reviewed.   - I have re-counseled the patient on diet management and weight loss  by adopting a carbohydrate restricted / protein rich  Diet.  -  Suggestion is made for him to avoid simple carbohydrates  from his diet including Cakes, Sweet Desserts / Pastries, Ice Cream, Soda (diet and regular), Sweet Tea, Candies, Chips, Cookies, Store Bought Juices, Alcohol in Excess of  1-2 drinks a day, Artificial Sweeteners, and "Sugar-free" Products. This will help patient to have stable blood glucose profile and potentially avoid unintended weight gain.   - Patient is advised to stick to a routine mealtimes to eat 3 meals  a day and avoid unnecessary snacks ( to snack only to correct hypoglycemia).  - I have approached patient with  the following individualized plan to manage diabetes and patient agrees.  -Patient is elderly who lives alone with no support at home.   -#1 goal of treatment in his case would be to avoid hypoglycemia.   -I discussed this concern with him and continued lower dose of Lantus 32 units nightly, lower NovoLog to 10 units  3 times a day before meals plus correction, when pre-meal blood glucoses above 70 mg/dL, associated with monitoring of blood glucose 4 times a day-before meals and at bedtime.  -In his situation, continuous glucose monitoring would have been helpful in his care, however, he insists he does not want to wear a sensor. -He has declined offers for the [employer], Dexcom devices. -Patient is warned not to take insulin without proper monitoring per orders.  -Patient is encouraged to call clinic for blood glucose levels less than 70 or above 300 mg /dl.  -Due to his renal hx , he is not a suitable candidate for metformin, SGLT2 inhibitors, and declined DPPV inhibitors.  - Patient specific target  for A1c; LDL, HDL, Triglycerides, and  Waist Circumference were discussed in detail.  2) BP/HTN: His blood pressure is controlled to target.  He is advised to continue his current blood pressure medications including Hyzaar 100-25 mg p.o. daily along with his amlodipine 10 mg p.o. Daily.  3) Lipids/HPL: Controlled, during his last labs LDL  74, he decided to stop pravastatin after reading in the Internet about sinus infections from statins.  I explained statins unlikely to cause sinus infections , he wouldn't reconsider it at this time.  4)  Weight/Diet: CDE consult in progress, exercise, and carbohydrates information provided.  5) Chronic Care/Health Maintenance:  -Patient is on ACEI/ARB and Statin medications and encouraged to continue to  follow up with Ophthalmology, Podiatrist at least yearly or according to recommendations, and advised to  stay away from smoking. I have recommended  yearly flu vaccine and pneumonia vaccination at least every 5 years; moderate intensity exercise for up to 150 minutes weekly; and  sleep for at least 7 hours a day.  I recommended for him to obtain medical alert device to call for EMS during times of emergency.   - I advised patient to maintain close follow up with Earney Mallet, MD for primary care needs.  - Time spent with the patient: 25 min, of which >50% was spent in reviewing his blood glucose logs , discussing his hypo- and hyper-glycemic episodes, reviewing his current and  previous labs and insulin doses and developing a plan to avoid hypo- and hyper-glycemia. Please refer to Patient Instructions for Blood Glucose Monitoring and Insulin/Medications Dosing Guide"  in media tab for additional information. Owynn Schmaltz participated in the discussions, expressed understanding, and voiced agreement with the above plans.  All questions were answered to his satisfaction. he is encouraged to contact clinic should he have any questions or concerns prior to his return visit.   Follow up plan: -Return in about 3 months (around 07/24/2018) for follow up with pre-visit labs, meter, and logs.  Glade Lloyd, MD Phone: [phone]  Fax: [phone]   -  This note was partially dictated with voice recognition software. Similar sounding words can be transcribed inadequately or may not  be corrected upon review.  04/23/2018, 11:04 AM

## 2018-04-23 NOTE — Patient Instructions (Signed)

## 2018-07-31 LAB — COMPLETE METABOLIC PANEL WITH GFR
AG RATIO: 2.1 (calc) (ref 1.0–2.5)
ALKALINE PHOSPHATASE (APISO): 112 U/L (ref 40–115)
ALT: 16 U/L (ref 9–46)
AST: 17 U/L (ref 10–35)
Albumin: 4.1 g/dL (ref 3.6–5.1)
BUN/Creatinine Ratio: 14 (calc) (ref 6–22)
BUN: 18 mg/dL (ref 7–25)
CHLORIDE: 93 mmol/L — AB (ref 98–110)
CO2: 28 mmol/L (ref 20–32)
Calcium: 10.1 mg/dL (ref 8.6–10.3)
Creat: 1.28 mg/dL — ABNORMAL HIGH (ref 0.70–1.18)
GFR, Est African American: 62 mL/min/{1.73_m2} (ref >=60)
GFR, Est Non African American: 53 mL/min/{1.73_m2} — ABNORMAL LOW (ref >=60)
Globulin: 2 g/dL (calc) (ref 1.9–3.7)
Glucose, Bld: 351 mg/dL — ABNORMAL HIGH (ref 65–99)
POTASSIUM: 4.9 mmol/L (ref 3.5–5.3)
Sodium: 130 mmol/L — ABNORMAL LOW (ref 135–146)
Total Bilirubin: 0.8 mg/dL (ref 0.2–1.2)
Total Protein: 6.1 g/dL (ref 6.1–8.1)

## 2018-07-31 LAB — HEMOGLOBIN A1C
EAG (MMOL/L): 10.4 (calc)
HEMOGLOBIN A1C: 8.2 %{Hb} — AB (ref <5.7)
MEAN PLASMA GLUCOSE: 189 (calc)

## 2018-08-04 ENCOUNTER — Ambulatory Visit: Payer: Medicare FFS | Admitting: "Endocrinology

## 2018-08-04 ENCOUNTER — Encounter: Payer: Self-pay | Admitting: "Endocrinology

## 2018-08-04 VITALS — BP 138/79 | HR 71 | Ht 71.0 in | Wt 212.0 lb

## 2018-08-04 DIAGNOSIS — I10 Essential (primary) hypertension: Secondary | ICD-10-CM | POA: Diagnosis not present

## 2018-08-04 DIAGNOSIS — E1165 Type 2 diabetes mellitus with hyperglycemia: Secondary | ICD-10-CM

## 2018-08-04 DIAGNOSIS — E782 Mixed hyperlipidemia: Secondary | ICD-10-CM

## 2018-08-04 NOTE — Progress Notes (Signed)
Endocrinology follow-up note   Subjective:    Patient ID: Noah Daniel, male    DOB: 1941/05/20, PCP Earney Mallet, MD   Past Medical History:  Diagnosis Date  . BPH (benign prostatic hyperplasia)   . CKD (chronic kidney disease)   . Diabetes mellitus, type II ([place])   . Diabetic neuropathy ([place])   . History of kidney cancer   . Hyperlipidemia   . Hypertension   . Osteoarthritis    Past Surgical History:  Procedure Laterality Date  . CARPAL TUNNEL RELEASE    . CATARACT EXTRACTION    . NEPHRECTOMY     Social History   Socioeconomic History  . Marital status: Unknown    Spouse name: Not on file  . Number of children: Not on file  . Years of education: Not on file  . Highest education level: Not on file  Occupational History  . Not on file  Social Needs  . Financial resource strain: Not on file  . Food insecurity:    Worry: Not on file    Inability: Not on file  . Transportation needs:    Medical: Not on file    Non-medical: Not on file  Tobacco Use  . Smoking status: Former Research scientist (life sciences)  . Smokeless tobacco: Never Used  Substance and Sexual Activity  . Alcohol use: No    Alcohol/week: 0.0 standard drinks  . Drug use: No  . Sexual activity: Not on file  Lifestyle  . Physical activity:    Days per week: Not on file    Minutes per session: Not on file  . Stress: Not on file  Relationships  . Social connections:    Talks on phone: Not on file    Gets together: Not on file    Attends religious service: Not on file    Active member of club or organization: Not on file    Attends meetings of clubs or organizations: Not on file    Relationship status: Not on file  Other Topics Concern  . Not on file  Social History Narrative  . Not on file   Outpatient Encounter Medications as of 08/04/2018  Medication Sig  . amLODipine (NORVASC) 10 MG tablet Take 10 mg by mouth daily.   Marland Kitchen aspirin 81 MG tablet Take 81 mg by mouth. Mon, wed, Fri  . blood glucose meter kit  and supplies KIT Dispense based on patient and insurance preference. Use up to four times daily as directed. (FOR ICD-10 E11.65)  . Blood Glucose Monitoring Suppl (TRUE METRIX METER) w/Device KIT USE TO TEST BLOOD SUGAR UP TO 4 TIMES A DAY AS DIRECTED  . insulin aspart (NOVOLOG) 100 UNIT/ML injection Use up to 50 units daily as directed (Patient taking differently: Inject 10-16 Units into the skin 3 (three) times daily with meals. Use up to 50 units daily as directed)  . insulin glargine (LANTUS) 100 UNIT/ML injection INJECT 32 UNITS SUBCUTANEOUSLY AT BEDTIME  . Insulin Syringe-Needle U-100 (RELION INSULIN SYR .3CC/29G) 29G X 1/2" 0.3 ML MISC Use to inject insulin 4 times a day.  . losartan-hydrochlorothiazide (HYZAAR) 100-25 MG tablet Take 1 tablet by mouth daily.  . Omega-3 Fatty Acids (FISH OIL) 1200 MG CAPS Take by mouth daily.  . pravastatin (PRAVACHOL) 20 MG tablet Take 20 mg by mouth. Mon, Wed, Fri  . RELION INSULIN SYR 0.5ML/31G 31G X 5/16" 0.5 ML MISC USE 1 SYRINGE 4 TIMES DAILY  . TRUE METRIX BLOOD GLUCOSE TEST test strip USE TO  TEST BLOOD SUGAR UP TO 4 TIMES DAILY AS DIRECTED  . Vitamin D, Cholecalciferol, 1000 UNITS CAPS Take by mouth daily.   No facility-administered encounter medications on file as of 08/04/2018.    ALLERGIES: No Known Allergies VACCINATION STATUS:  There is no immunization history on file for this patient.  Diabetes  He presents for his follow-up diabetic visit. He has type 2 diabetes mellitus. Onset time: He was diagnosed at approximate age of 38 years. His disease course has been improving. There are no hypoglycemic associated symptoms. Pertinent negatives for hypoglycemia include no confusion, headaches, pallor or seizures. Pertinent negatives for diabetes include no chest pain, no fatigue, no polydipsia, no polyphagia, no polyuria and no weakness. There are no hypoglycemic complications. Symptoms are improving. Diabetic complications include peripheral  neuropathy. 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 Current diabetic treatment includes intensive insulin program. He is compliant with treatment most of the time. His weight is fluctuating minimally. He is following a generally unhealthy diet. He has not had a previous visit with a dietitian. He participates in exercise intermittently. His home blood glucose trend is fluctuating minimally (He Surman gets some random hypoglycemia but at much less frequency than before.). His breakfast blood glucose range is generally >200 mg/dl. His lunch blood glucose range is generally 180-200 mg/dl. His dinner blood glucose range is generally 180-200 mg/dl. His overall blood glucose range is 180-200 mg/dl. (He came with overall above target glycemic profile, however much less frequent hypoglycemia.  His A1c is better at 8.2%. ) An ACE inhibitor/angiotensin II receptor blocker is being taken. Eye exam is current.  Hyperlipidemia  This is a chronic problem. The current episode started more than 1 year ago. Exacerbating diseases include diabetes. Pertinent negatives include no chest pain, myalgias or shortness of breath. Current antihyperlipidemic treatment includes statins. Risk factors for coronary artery disease include dyslipidemia, diabetes mellitus, hypertension, male sex and obesity.  Hypertension  This is a chronic problem. The current episode started more than 1 year ago. The problem is uncontrolled. Pertinent negatives include no chest pain, headaches, neck pain, palpitations or shortness of breath. Risk factors for coronary artery disease include diabetes mellitus, dyslipidemia, male gender, obesity and smoking/tobacco exposure. Past treatments include angiotensin blockers and diuretics.    Review of Systems  Constitutional: Negative for fatigue and  unexpected weight change.  HENT: Negative for dental problem, mouth sores and trouble swallowing.   Eyes: Negative for visual disturbance.  Respiratory: Negative for cough, choking, chest tightness, shortness of breath and wheezing.   Cardiovascular: Negative for chest pain, palpitations and leg swelling.  Gastrointestinal: Negative for abdominal distention, abdominal pain, constipation, diarrhea, nausea and vomiting.  Endocrine: Negative for polydipsia, polyphagia and polyuria.  Genitourinary: Negative for dysuria, flank pain, hematuria and urgency.  Musculoskeletal: Negative for back pain, gait problem, myalgias and neck pain.  Skin: Negative for pallor, rash and wound.  Neurological: Negative for seizures, syncope, weakness, numbness and headaches.  Psychiatric/Behavioral: Negative.  Negative for confusion and dysphoric mood.    Objective:    BP 138/79   Pulse 71   Ht '5\' 11"'  (1.803 m)   Wt 212 lb (96.2 kg)   BMI 29.57 kg/m   Wt Readings from Last 3 Encounters:  08/04/18 212 lb (96.2 kg)  04/23/18 210 lb (95.3 kg)  12/19/17 209 lb (94.8 kg)    Physical Exam  Constitutional: He is  oriented to person, place, and time. He appears well-developed. He is cooperative. No distress.  HENT:  Head: Normocephalic and atraumatic.  Eyes: EOM are normal.  Neck: Normal range of motion. Neck supple. No tracheal deviation present. No thyromegaly present.  Cardiovascular: Normal rate, S1 normal and S2 normal. Exam reveals no gallop.  No murmur heard. Pulses:      Dorsalis pedis pulses are 1+ on the right side, and 1+ on the left side.       Posterior tibial pulses are 1+ on the right side, and 1+ on the left side.  Pulmonary/Chest: Effort normal. No respiratory distress. He has no wheezes.  Abdominal: He exhibits no distension. There is no tenderness. There is no guarding and no CVA tenderness.  Musculoskeletal: He exhibits no edema.       Right shoulder: He exhibits no swelling and no  deformity.  Neurological: He is alert and oriented to person, place, and time. He has normal strength. No cranial nerve deficit or sensory deficit. Gait normal.  Skin: Skin is warm and dry. No rash noted. No cyanosis. Nails show no clubbing.  Psychiatric: He has a normal mood and affect. His speech is normal. Judgment normal. Cognition and memory are normal.   Recent Results (from the past 2160 hour(s))  COMPLETE METABOLIC PANEL WITH GFR     Status: Abnormal   Collection Time: 07/30/18 10:30 AM  Result Value Ref Range   Glucose, Bld 351 (H) 65 - 99 mg/dL    Comment: .            Fasting reference interval . For someone without known diabetes, a glucose value >125 mg/dL indicates that they may have diabetes and this should be confirmed with a follow-up test. .    BUN 18 7 - 25 mg/dL   Creat 1.28 (H) 0.70 - 1.18 mg/dL    Comment: For patients >62 years of age, the reference limit for Creatinine is approximately 13% higher for people identified as African-American. .    GFR, Est Non African American 53 (L) > OR = 60 mL/min/1.33m   GFR, Est African American 62 > OR = 60 mL/min/1.711m  BUN/Creatinine Ratio 14 6 - 22 (calc)   Sodium 130 (L) 135 - 146 mmol/L   Potassium 4.9 3.5 - 5.3 mmol/L   Chloride 93 (L) 98 - 110 mmol/L   CO2 28 20 - 32 mmol/L   Calcium 10.1 8.6 - 10.3 mg/dL   Total Protein 6.1 6.1 - 8.1 g/dL   Albumin 4.1 3.6 - 5.1 g/dL   Globulin 2.0 1.9 - 3.7 g/dL (calc)   AG Ratio 2.1 1.0 - 2.5 (calc)   Total Bilirubin 0.8 0.2 - 1.2 mg/dL   Alkaline phosphatase (APISO) 112 40 - 115 U/L   AST 17 10 - 35 U/L   ALT 16 9 - 46 U/L  Hemoglobin A1c     Status: Abnormal   Collection Time: 07/30/18 10:30 AM  Result Value Ref Range   Hgb A1c MFr Bld 8.2 (H) <5.7 % of total Hgb    Comment: For someone without known diabetes, a hemoglobin A1c value of 6.5% or greater indicates that they may have  diabetes and this should be confirmed with a follow-up  test. . For someone  with known diabetes, a value <7% indicates  that their diabetes is well controlled and a value  greater than or equal to 7% indicates suboptimal  control. A1c targets should be individualized based on  duration of diabetes, age, comorbid conditions, and  other considerations. . Currently, no consensus exists regarding use of hemoglobin A1c for diagnosis of diabetes for children. .    Mean Plasma Glucose 189 (calc)   eAG (mmol/L) 10.4 (calc)   Lipid Panel     Component Value Date/Time   CHOL 165 05/27/2017 1132   TRIG 133 05/27/2017 1132   HDL 38 (L) 05/27/2017 1132   CHOLHDL 4.3 05/27/2017 1132   VLDL 16 09/28/2016 0859   LDLCALC 74 09/28/2016 0859     Assessment & Plan:   1. Uncontrolled type 2 diabetes mellitus with complication, with long-term current use of insulin ([place])  - Patient remains at a high risk for more acute and chronic complications of diabetes which include CAD, CVA, CKD, retinopathy, and neuropathy. These are all discussed in detail with the patient.  Patient came with  [place] fluctuating, however safer  glycemic profile.  His A1c is better at 8.2% . -   He insists that he has to control diabetes to a1c of 6%. I discussed with him safe target for his a1c is 7.5-8%. He is reluctant.   - Glucose logs and insulin administration records pertaining to this visit,  to be scanned into patient's records.  Recent labs reviewed.   - I have re-counseled the patient on diet management and weight loss  by adopting a carbohydrate restricted / protein rich  Diet.  -  Suggestion is made for him to avoid simple carbohydrates  from his diet including Cakes, Sweet Desserts / Pastries, Ice Cream, Soda (diet and regular), Sweet Tea, Candies, Chips, Cookies, Store Bought Juices, Alcohol in Excess of  1-2 drinks a day, Artificial Sweeteners, and "Sugar-free" Products. This will help patient to have stable blood glucose profile and potentially avoid unintended weight gain.  -  Patient is advised to stick to a routine mealtimes to eat 3 meals  a day and avoid unnecessary snacks ( to snack only to correct hypoglycemia).  - I have approached patient with the following individualized plan to manage diabetes and patient agrees.  -Patient is elderly who lives alone with no support at home.   -#1 goal of treatment in his case will be to avoid hypoglycemia.   -I discussed this concern with him and continued Lantus 32 units nightly,  NovoLog  10 units  3 times a day before meals plus correction, when pre-meal blood glucoses above 70 mg/dL, associated with monitoring of blood glucose 4 times a day-before meals and at bedtime.  -In his situation, continuous glucose monitoring would have been helpful in his care, however, he insists he does not want to wear a sensor. -He has declined offers for the [employer], Dexcom devices. -Patient is warned not to take insulin without proper monitoring per orders.  -Patient is encouraged to call clinic for blood glucose levels less than 70 or above 300 mg /dl.  -Due to his renal hx , he is not a suitable candidate for metformin, SGLT2 inhibitors, and declined DPPV inhibitors.  - Patient specific target  for A1c; LDL, HDL, Triglycerides, and  Waist Circumference were discussed in detail.  2) BP/HTN: His blood pressure is controlled to target.  He is advised to continue his current blood pressure medications including Hyzaar 100-25 mg p.o. daily along with his amlodipine 10 mg p.o. Daily.  3) Lipids/HPL: His August lipid panel showed controlled LDL  74, he decided to stop pravastatin after reading in the Internet about sinus infections from statins.  I explained statins unlikely to cause sinus infections , he wouldn't reconsider it at this time.  4)  Weight/Diet: CDE consult in progress, exercise, and carbohydrates information provided.  5) Chronic Care/Health Maintenance:  -Patient is on ACEI/ARB and Statin medications and encouraged  to continue to follow up with Ophthalmology, Podiatrist at least yearly or according to recommendations, and advised to  stay away from smoking. I have recommended yearly flu vaccine and pneumonia vaccination at least every 5 years; moderate intensity exercise for up to 150 minutes weekly; and  sleep for at least 7 hours a day.  I recommended for him to obtain medical alert device to call for EMS during times of emergency.   - I advised patient to maintain close follow up with Earney Mallet, MD for primary care needs.  - Time spent with the patient: 25 min, of which >50% was spent in reviewing his blood glucose logs , discussing his hypo- and hyper-glycemic episodes, reviewing his current and  previous labs and insulin doses and developing a plan to avoid hypo- and hyper-glycemia. Please refer to Patient Instructions for Blood Glucose Monitoring and Insulin/Medications Dosing Guide"  in media tab for additional information. Traven Crocker participated in the discussions, expressed understanding, and voiced agreement with the above plans.  All questions were answered to his satisfaction. he is encouraged to contact clinic should he have any questions or concerns prior to his return visit.   Follow up plan: -Return in about 4 months (around 12/05/2018) for Meter, and Logs, Follow up with Pre-visit Labs, Meter, and Logs.  Glade Lloyd, MD Phone: [phone]  Fax: [phone]   -  This note was partially dictated with voice recognition software. Similar sounding words can be transcribed inadequately or may not  be corrected upon review.  08/04/2018, 9:01 AM

## 2018-08-04 NOTE — Patient Instructions (Signed)

## 2018-08-12 ENCOUNTER — Other Ambulatory Visit: Payer: Self-pay | Admitting: "Endocrinology

## 2018-08-15 ENCOUNTER — Other Ambulatory Visit: Payer: Self-pay

## 2018-08-15 MED ORDER — GLUCOSE BLOOD VI STRP: ORAL_STRIP | 5 refills | Status: DC

## 2018-09-03 LAB — LIPID PANEL
Cholesterol: 168 (ref 0–200)
HDL: 45 (ref 35–70)
LDL Cholesterol: 94
Triglycerides: 172 — AB (ref 40–160)

## 2018-09-03 LAB — BASIC METABOLIC PANEL
BUN: 15 (ref 4–21)
Creatinine: 1.1 (ref 0.6–1.3)

## 2018-10-02 ENCOUNTER — Other Ambulatory Visit: Payer: Self-pay | Admitting: "Endocrinology

## 2018-12-08 ENCOUNTER — Encounter: Payer: Self-pay | Admitting: "Endocrinology

## 2018-12-08 ENCOUNTER — Other Ambulatory Visit: Payer: Self-pay

## 2018-12-08 ENCOUNTER — Ambulatory Visit: Payer: Medicare FFS | Admitting: "Endocrinology

## 2018-12-08 VITALS — BP 164/73 | HR 68 | Temp 97.5°F | Ht 71.0 in | Wt 216.8 lb

## 2018-12-08 DIAGNOSIS — E782 Mixed hyperlipidemia: Secondary | ICD-10-CM

## 2018-12-08 DIAGNOSIS — I10 Essential (primary) hypertension: Secondary | ICD-10-CM

## 2018-12-08 DIAGNOSIS — E1165 Type 2 diabetes mellitus with hyperglycemia: Secondary | ICD-10-CM | POA: Diagnosis not present

## 2018-12-08 NOTE — Patient Instructions (Signed)

## 2018-12-08 NOTE — Progress Notes (Signed)
Endocrinology follow-up note   Subjective:    Patient ID: Noah Daniel, male    DOB: [birth year], PCP Earney Mallet, MD   Past Medical History:  Diagnosis Date  . BPH (benign prostatic hyperplasia)   . CKD (chronic kidney disease)   . Diabetes mellitus, type II ([place])   . Diabetic neuropathy ([place])   . History of kidney cancer   . Hyperlipidemia   . Hypertension   . Osteoarthritis    Past Surgical History:  Procedure Laterality Date  . CARPAL TUNNEL RELEASE    . CATARACT EXTRACTION    . NEPHRECTOMY     Social History   Socioeconomic History  . Marital status: Unknown    Spouse name: Not on file  . Number of children: Not on file  . Years of education: Not on file  . Highest education level: Not on file  Occupational History  . Not on file  Social Needs  . Financial resource strain: Not on file  . Food insecurity:    Worry: Not on file    Inability: Not on file  . Transportation needs:    Medical: Not on file    Non-medical: Not on file  Tobacco Use  . Smoking status: Former Research scientist (life sciences)  . Smokeless tobacco: Never Used  Substance and Sexual Activity  . Alcohol use: No    Alcohol/week: 0.0 standard drinks  . Drug use: No  . Sexual activity: Not on file  Lifestyle  . Physical activity:    Days per week: Not on file    Minutes per session: Not on file  . Stress: Not on file  Relationships  . Social connections:    Talks on phone: Not on file    Gets together: Not on file    Attends religious service: Not on file    Active member of club or organization: Not on file    Attends meetings of clubs or organizations: Not on file    Relationship status: Not on file  Other Topics Concern  . Not on file  Social History Narrative  . Not on file   Outpatient Encounter Medications as of 12/08/2018  Medication Sig  . amLODipine (NORVASC) 10 MG tablet Take 10 mg by mouth daily.   Marland Kitchen aspirin 81 MG tablet Take 81 mg by mouth. Mon, wed, Fri  . BD INSULIN SYRINGE U/F  31G X 5/16" 0.5 ML MISC USE 1 SYRINGE 4 TIMES DAILY  . blood glucose meter kit and supplies KIT Dispense based on patient and insurance preference. Use up to four times daily as directed. (FOR ICD-10 E11.65)  . Blood Glucose Monitoring Suppl (TRUE METRIX METER) w/Device KIT USE TO TEST BLOOD SUGAR UP TO 4 TIMES A DAY AS DIRECTED  . glucose blood (TRUE METRIX BLOOD GLUCOSE TEST) test strip USE 1 STRIP TO CHECK GLUCOSE UP TO 4 TIMES DAILY AS DIRECTED. E11.65  . glucose blood test strip 1 each by Other route 4 (four) times daily. Use as instructed  . insulin aspart (NOVOLOG) 100 UNIT/ML injection Use up to 50 units daily as directed (Patient taking differently: Inject 10-16 Units into the skin 3 (three) times daily with meals. Use up to 50 units daily as directed)  . insulin glargine (LANTUS) 100 UNIT/ML injection INJECT 32 UNITS SUBCUTANEOUSLY AT BEDTIME  . Insulin Syringe-Needle U-100 (RELION INSULIN SYR .3CC/29G) 29G X 1/2" 0.3 ML MISC Use to inject insulin 4 times a day.  . losartan-hydrochlorothiazide (HYZAAR) 100-25 MG tablet Take 1 tablet  by mouth daily.  . Omega-3 Fatty Acids (FISH OIL) 1200 MG CAPS Take by mouth daily.  . pravastatin (PRAVACHOL) 20 MG tablet Take 20 mg by mouth. Mon, Wed, Fri  . Vitamin D, Cholecalciferol, 1000 UNITS CAPS Take by mouth daily.   No facility-administered encounter medications on file as of 12/08/2018.    ALLERGIES: Allergies  Allergen Reactions  . Statins Other (See Comments)    joint pain   VACCINATION STATUS:  There is no immunization history on file for this patient.  Diabetes  He presents for his follow-up diabetic visit. He has type 2 diabetes mellitus. Onset time: He was diagnosed at approximate age of 32 years. His disease course has been stable. There are no hypoglycemic associated symptoms. Pertinent negatives for hypoglycemia include no confusion, headaches, pallor or seizures. Pertinent negatives for diabetes include no chest pain, no fatigue,  no polydipsia, no polyphagia, no polyuria and no weakness. There are no hypoglycemic complications. Symptoms are stable. Diabetic complications include peripheral neuropathy. 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 Current diabetic treatment includes intensive insulin program. He is compliant with treatment most of the time. His weight is fluctuating minimally. He is following a generally unhealthy diet. He has not had a previous visit with a dietitian. He participates in exercise intermittently. His home blood glucose trend is fluctuating minimally. His breakfast blood glucose range is generally 180-200 mg/dl. His lunch blood glucose range is generally 180-200 mg/dl. His dinner blood glucose range is generally 180-200 mg/dl. His overall blood glucose range is 180-200 mg/dl. (He came without his labs.  His last A1c was 8.2%.  His glycemic profile shows much less frequency of hypoglycemia compared to his previous visits.    ) An ACE inhibitor/angiotensin II receptor blocker is being taken. Eye exam is current.  Hyperlipidemia  This is a chronic problem. The current episode started more than 1 year ago. Exacerbating diseases include diabetes. Pertinent negatives include no chest pain, myalgias or shortness of breath. Current antihyperlipidemic treatment includes statins. Risk factors for coronary artery disease include dyslipidemia, diabetes mellitus, hypertension, male sex and obesity.  Hypertension  This is a chronic problem. The current episode started more than 1 year ago. The problem is uncontrolled. Pertinent negatives include no chest pain, headaches, neck pain, palpitations or shortness of breath. Risk factors for coronary artery disease include diabetes mellitus, dyslipidemia, male gender, obesity and smoking/tobacco exposure. Past treatments  include angiotensin blockers and diuretics.    Review of Systems  Constitutional: Negative for fatigue and unexpected weight change.  HENT: Negative for dental problem, mouth sores and trouble swallowing.   Eyes: Negative for visual disturbance.  Respiratory: Negative for cough, choking, chest tightness, shortness of breath and wheezing.   Cardiovascular: Negative for chest pain, palpitations and leg swelling.  Gastrointestinal: Negative for abdominal distention, abdominal pain, constipation, diarrhea, nausea and vomiting.  Endocrine: Negative for polydipsia, polyphagia and polyuria.  Genitourinary: Negative for dysuria, flank pain, hematuria and urgency.  Musculoskeletal: Negative for back pain, gait problem, myalgias and neck pain.  Skin: Negative for pallor, rash and wound.  Neurological: Negative for seizures, syncope, weakness, numbness and headaches.  Psychiatric/Behavioral: Negative.  Negative for confusion and dysphoric mood.    Objective:    BP (!) 164/73 (BP Location: Left Arm, Patient Position: Sitting, Cuff Size: Normal)   Pulse 68   Temp (!) 97.5 F (36.4 C) (Oral)  Ht '5\' 11"'  (1.803 m)   Wt 216 lb 12.8 oz (98.3 kg)   SpO2 91%   BMI 30.24 kg/m   Wt Readings from Last 3 Encounters:  12/08/18 216 lb 12.8 oz (98.3 kg)  08/04/18 212 lb (96.2 kg)  04/23/18 210 lb (95.3 kg)    Physical Exam  Constitutional: He is oriented to person, place, and time. He appears well-developed. He is cooperative. No distress.  HENT:  Head: Normocephalic and atraumatic.  Eyes: EOM are normal.  Neck: Normal range of motion. Neck supple. No tracheal deviation present. No thyromegaly present.  Cardiovascular: Normal rate, S1 normal and S2 normal. Exam reveals no gallop.  No murmur heard. Pulses:      Dorsalis pedis pulses are 1+ on the right side and 1+ on the left side.       Posterior tibial pulses are 1+ on the right side and 1+ on the left side.  Pulmonary/Chest: Effort normal. No  respiratory distress. He has no wheezes.  Abdominal: He exhibits no distension. There is no abdominal tenderness. There is no guarding and no CVA tenderness.  Musculoskeletal:        General: No edema.     Right shoulder: He exhibits no swelling and no deformity.  Neurological: He is alert and oriented to person, place, and time. He has normal strength. No cranial nerve deficit or sensory deficit. Gait normal.  Skin: Skin is warm and dry. No rash noted. No cyanosis. Nails show no clubbing.  Psychiatric: He has a normal mood and affect. His speech is normal. Judgment normal. Cognition and memory are normal.   Lipid Panel     Component Value Date/Time   CHOL 168 09/03/2018   TRIG 172 (A) 09/03/2018   HDL 45 09/03/2018   CHOLHDL 4.3 05/27/2017 1132   VLDL 16 09/28/2016 0859   LDLCALC 94 09/03/2018    Assessment & Plan:   1. Uncontrolled type 2 diabetes mellitus with complication, with long-term current use of insulin ([place])  - Patient remains at a high risk for more acute and chronic complications of diabetes which include CAD, CVA, CKD, retinopathy, and neuropathy. These are all discussed in detail with the patient.  Patient came with  [place] fluctuating, however safer glycemic profile than his previous visits.  His most recent labs are not reported, A1c from last visit was 8.2%.    -   He insists that he has to control diabetes to a1c of 6%. I discussed with him safe target for his a1c is 7.5-8%. He is reluctant.   - Glucose logs and insulin administration records pertaining to this visit,  to be scanned into patient's records.  Recent labs reviewed.   - I have re-counseled the patient on diet management and weight loss  by adopting a carbohydrate restricted / protein rich  Diet.  - Patient admits there is a room for improvement in his diet and drink choices. -  Suggestion is made for him to avoid simple carbohydrates  from his diet including Cakes, Sweet Desserts / Pastries, Ice  Cream, Soda (diet and regular), Sweet Tea, Candies, Chips, Cookies, Store Bought Juices, Alcohol in Excess of  1-2 drinks a day, Artificial Sweeteners, and "Sugar-free" Products. This will help patient to have stable blood glucose profile and potentially avoid unintended weight gain.  - Patient is advised to stick to a routine mealtimes to eat 3 meals  a day and avoid unnecessary snacks ( to snack only to correct hypoglycemia).  -  I have approached patient with the following individualized plan to manage diabetes and patient agrees.  -This patient is elderly who lives alone with no support at home.     -#1 goal of treatment in his case will be to avoid hypoglycemia.   -I discussed this concern with him, and reluctantly accepts the plan to avoid tight glycemic profile.    -I kept him on the same dose of basal insulin Lantus 32  units nightly,  NovoLog  10 units  3 times a day before meals plus correction, when pre-meal blood glucoses above 70 mg/dL, associated with monitoring of blood glucose 4 times a day-before meals and at bedtime.  -In his situation, continuous glucose monitoring would have been helpful in his care,  however, he insists he does not want to wear a sensor. -He has declined offers for the [employer], Dexcom devices. -Patient is warned not to take insulin without proper monitoring per orders.  -He is encouraged to call clinic for blood glucose levels less than 70 or above 300 mg /dl.  -Due to his renal hx , he is not a suitable candidate for metformin, SGLT2 inhibitors, and declined DPPV inhibitors.  - Patient specific target  for A1c; LDL, HDL, Triglycerides, and  Waist Circumference were discussed in detail.  2) BP/HTN: His blood pressure is not controlled to target this morning, usually well controlled to target during his visits.   He is advised to continue his current blood pressure medications including Hyzaar 100-25 mg p.o. daily along with his amlodipine 10 mg p.o.  Daily.  3) Lipids/HPL: His August lipid panel showed controlled LDL  74, he decided to stop pravastatin after reading in the Internet about sinus infections from statins.  I explained statins unlikely to cause sinus infections , he wouldn't reconsider it at this time.  4)  Weight/Diet: CDE consult in progress, exercise, and carbohydrates information provided.  5) Chronic Care/Health Maintenance:  -Patient is on ACEI/ARB and Statin medications and encouraged to continue to follow up with Ophthalmology, Podiatrist at least yearly or according to recommendations, and advised to  stay away from smoking. I have recommended yearly flu vaccine and pneumonia vaccination at least every 5 years; moderate intensity exercise for up to 150 minutes weekly; and  sleep for at least 7 hours a day.  I recommended for him to obtain medical alert device to call for EMS during times of emergency.   - I advised patient to maintain close follow up with Earney Mallet, MD for primary care needs.  - Time spent with the patient: 25 min, of which >50% was spent in reviewing his blood glucose logs , discussing his hypoglycemia and hyperglycemia episodes, reviewing his current and  previous labs / studies and medications  doses and developing a plan to avoid hypoglycemia and hyperglycemia. Please refer to Patient Instructions for Blood Glucose Monitoring and Insulin/Medications Dosing Guide"  in media tab for additional information. Silvio Italiano participated in the discussions, expressed understanding, and voiced agreement with the above plans.  All questions were answered to his satisfaction. he is encouraged to contact clinic should he have any questions or concerns prior to his return visit.   Follow up plan: -Return in about 3 months (around 03/08/2019) for Follow up with Pre-visit Labs, Meter, and Logs.  Glade Lloyd, MD Phone: [phone]  Fax: [phone]   -  This note was partially dictated with voice  recognition software. Similar sounding words can be transcribed inadequately or may not  be corrected upon review.  12/08/2018, 10:16 AM

## 2018-12-11 ENCOUNTER — Telehealth: Payer: Self-pay | Admitting: *Deleted

## 2018-12-11 NOTE — Telephone Encounter (Signed)
Patient called stating Dr Fransico Him told him to call to give him his lab results A1C 8.0 . Patient ask for a call back

## 2019-02-28 LAB — MICROALBUMIN / CREATININE URINE RATIO
Creatinine, Urine: 64 mg/dL (ref 20–320)
Microalb Creat Ratio: 89 mcg/mg creat — ABNORMAL HIGH (ref <30)
Microalb, Ur: 5.7 mg/dL

## 2019-02-28 LAB — TSH: TSH: 2.06 mIU/L (ref 0.40–4.50)

## 2019-02-28 LAB — COMPLETE METABOLIC PANEL WITH GFR
AG Ratio: 2.1 (calc) (ref 1.0–2.5)
ALBUMIN MSPROF: 3.9 g/dL (ref 3.6–5.1)
ALT: 20 U/L (ref 9–46)
AST: 18 U/L (ref 10–35)
Alkaline phosphatase (APISO): 90 U/L (ref 35–144)
BUN / CREAT RATIO: 12 (calc) (ref 6–22)
BUN: 15 mg/dL (ref 7–25)
CALCIUM: 10 mg/dL (ref 8.6–10.3)
CO2: 28 mmol/L (ref 20–32)
CREATININE: 1.22 mg/dL — AB (ref 0.70–1.18)
Chloride: 104 mmol/L (ref 98–110)
GFR, EST AFRICAN AMERICAN: 65 mL/min/{1.73_m2} (ref >=60)
GFR, EST NON AFRICAN AMERICAN: 56 mL/min/{1.73_m2} — AB (ref >=60)
GLUCOSE: 192 mg/dL — AB (ref 65–139)
Globulin: 1.9 g/dL (calc) (ref 1.9–3.7)
Potassium: 4.3 mmol/L (ref 3.5–5.3)
Sodium: 137 mmol/L (ref 135–146)
TOTAL PROTEIN: 5.8 g/dL — AB (ref 6.1–8.1)
Total Bilirubin: 0.9 mg/dL (ref 0.2–1.2)

## 2019-02-28 LAB — HEMOGLOBIN A1C
HEMOGLOBIN A1C: 7.7 %{Hb} — AB (ref <5.7)
MEAN PLASMA GLUCOSE: 174 (calc)
eAG (mmol/L): 9.7 (calc)

## 2019-02-28 LAB — T4, FREE: Free T4: 1.1 ng/dL (ref 0.8–1.8)

## 2019-02-28 LAB — VITAMIN D 25 HYDROXY (VIT D DEFICIENCY, FRACTURES): Vit D, 25-Hydroxy: 36 ng/mL (ref 30–100)

## 2019-03-06 ENCOUNTER — Other Ambulatory Visit: Payer: Self-pay

## 2019-03-06 ENCOUNTER — Ambulatory Visit (INDEPENDENT_AMBULATORY_CARE_PROVIDER_SITE_OTHER): Payer: Medicare FFS | Admitting: "Endocrinology

## 2019-03-06 ENCOUNTER — Encounter: Payer: Self-pay | Admitting: "Endocrinology

## 2019-03-06 DIAGNOSIS — I10 Essential (primary) hypertension: Secondary | ICD-10-CM | POA: Diagnosis not present

## 2019-03-06 DIAGNOSIS — E1165 Type 2 diabetes mellitus with hyperglycemia: Secondary | ICD-10-CM

## 2019-03-06 DIAGNOSIS — E782 Mixed hyperlipidemia: Secondary | ICD-10-CM

## 2019-03-06 NOTE — Progress Notes (Signed)
03/06/2019                                                    Endocrinology Telehealth Visit Follow up Note -During COVID -19 Pandemic  This visit type was conducted due to national recommendations for restrictions regarding the COVID-19 Pandemic  in an effort to limit this patient's exposure and mitigate transmission of the corona virus.  Due to his co-morbid illnesses, Noah Daniel is at  moderate to high risk for complications without adequate follow up.  This format is felt to be most appropriate for him at this time.  I connected with this patient on 03/06/2019   by telephone and verified that I am speaking with the correct person using two identifiers. Noah Daniel, [birth year]. he has verbally consented to this visit. All issues noted in this document were discussed and addressed. The format was not optimal for physical exam.     Subjective:    Patient ID: Noah Daniel, male    DOB: 03-01-1941, PCP Earney Mallet, MD   Past Medical History:  Diagnosis Date  . BPH (benign prostatic hyperplasia)   . CKD (chronic kidney disease)   . Diabetes mellitus, type II ([place])   . Diabetic neuropathy ([place])   . History of kidney cancer   . Hyperlipidemia   . Hypertension   . Osteoarthritis    Past Surgical History:  Procedure Laterality Date  . CARPAL TUNNEL RELEASE    . CATARACT EXTRACTION    . NEPHRECTOMY     Social History   Socioeconomic History  . Marital status: Unknown    Spouse name: Not on file  . Number of children: Not on file  . Years of education: Not on file  . Highest education level: Not on file  Occupational History  . Not on file  Social Needs  . Financial resource strain: Not on file  . Food insecurity:    Worry: Not on file    Inability: Not on file  . Transportation needs:    Medical: Not on file    Non-medical: Not on file  Tobacco Use  . Smoking status: Former Research scientist (life sciences)  . Smokeless tobacco: Never Used  Substance and Sexual Activity  . Alcohol use:  No    Alcohol/week: 0.0 standard drinks  . Drug use: No  . Sexual activity: Not on file  Lifestyle  . Physical activity:    Days per week: Not on file    Minutes per session: Not on file  . Stress: Not on file  Relationships  . Social connections:    Talks on phone: Not on file    Gets together: Not on file    Attends religious service: Not on file    Active member of club or organization: Not on file    Attends meetings of clubs or organizations: Not on file    Relationship status: Not on file  Other Topics Concern  . Not on file  Social History Narrative  . Not on file   Outpatient Encounter Medications as of 03/06/2019  Medication Sig  . amLODipine (NORVASC) 10 MG tablet Take 10 mg by mouth daily.   Marland Kitchen aspirin 81 MG tablet Take 81 mg by mouth. Mon, wed, Fri  . BD INSULIN SYRINGE U/F 31G X 5/16" 0.5 ML MISC USE 1 SYRINGE 4 TIMES  DAILY  . blood glucose meter kit and supplies KIT Dispense based on patient and insurance preference. Use up to four times daily as directed. (FOR ICD-10 E11.65)  . Blood Glucose Monitoring Suppl (TRUE METRIX METER) w/Device KIT USE TO TEST BLOOD SUGAR UP TO 4 TIMES A DAY AS DIRECTED  . glucose blood (TRUE METRIX BLOOD GLUCOSE TEST) test strip USE 1 STRIP TO CHECK GLUCOSE UP TO 4 TIMES DAILY AS DIRECTED. E11.65  . glucose blood test strip 1 each by Other route 4 (four) times daily. Use as instructed  . insulin aspart (NOVOLOG) 100 UNIT/ML injection Use up to 50 units daily as directed (Patient taking differently: Inject 10-16 Units into the skin 3 (three) times daily with meals. Use up to 50 units daily as directed)  . insulin glargine (LANTUS) 100 UNIT/ML injection INJECT 32 UNITS SUBCUTANEOUSLY AT BEDTIME  . Insulin Syringe-Needle U-100 (RELION INSULIN SYR .3CC/29G) 29G X 1/2" 0.3 ML MISC Use to inject insulin 4 times a day.  . losartan-hydrochlorothiazide (HYZAAR) 100-25 MG tablet Take 1 tablet by mouth daily.  . Omega-3 Fatty Acids (FISH OIL) 1200 MG  CAPS Take by mouth daily.  . pravastatin (PRAVACHOL) 20 MG tablet Take 20 mg by mouth. Mon, Wed, Fri  . Vitamin D, Cholecalciferol, 1000 UNITS CAPS Take by mouth daily.   No facility-administered encounter medications on file as of 03/06/2019.    ALLERGIES: Allergies  Allergen Reactions  . Statins Other (See Comments)    joint pain   VACCINATION STATUS:  There is no immunization history on file for this patient.  Diabetes  He presents for his follow-up diabetic visit. He has type 2 diabetes mellitus. Onset time: He was diagnosed at approximate age of 72 years. His disease course has been improving. There are no hypoglycemic associated symptoms. Pertinent negatives for hypoglycemia include no confusion, headaches, pallor or seizures. Pertinent negatives for diabetes include no chest pain, no fatigue, no polydipsia, no polyphagia, no polyuria and no weakness. There are no hypoglycemic complications. Symptoms are improving. Diabetic complications include peripheral neuropathy. 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 Current diabetic treatment includes intensive insulin program. He is compliant with treatment most of the time. His weight is fluctuating minimally. He is following a generally unhealthy diet. He has not had a previous visit with a dietitian. He participates in exercise intermittently. His home blood glucose trend is fluctuating minimally. His breakfast blood glucose range is generally 140-180 mg/dl. His lunch blood glucose range is generally 140-180 mg/dl. His dinner blood glucose range is generally 180-200 mg/dl. His bedtime blood glucose range is generally 180-200 mg/dl. His overall blood glucose range is 180-200 mg/dl. (   ) An ACE inhibitor/angiotensin II receptor blocker is being taken. Eye exam is current.  Hyperlipidemia   This is a chronic problem. The current episode started more than 1 year ago. Exacerbating diseases include diabetes. Pertinent negatives include no chest pain, myalgias or shortness of breath. Current antihyperlipidemic treatment includes statins. Risk factors for coronary artery disease include dyslipidemia, diabetes mellitus, hypertension, male sex and obesity.  Hypertension  This is a chronic problem. The current episode started more than 1 year ago. The problem is uncontrolled. Pertinent negatives include no chest pain, headaches, neck pain, palpitations or shortness of breath. Risk factors for coronary artery disease include diabetes mellitus, dyslipidemia, male gender, obesity and smoking/tobacco exposure. Past treatments include angiotensin blockers and  diuretics.     Objective:    There were no vitals taken for this visit.  Wt Readings from Last 3 Encounters:  12/08/18 216 lb 12.8 oz (98.3 kg)  08/04/18 212 lb (96.2 kg)  04/23/18 210 lb (95.3 kg)    Lipid Panel     Component Value Date/Time   CHOL 168 09/03/2018   TRIG 172 (A) 09/03/2018   HDL 45 09/03/2018   CHOLHDL 4.3 05/27/2017 1132   VLDL 16 09/28/2016 0859   LDLCALC 94 09/03/2018    Assessment & Plan:   1. Uncontrolled type 2 diabetes mellitus with complication, with long-term current use of insulin ([place])  - Patient remains at a high risk for more acute and chronic complications of diabetes which include CAD, CVA, CKD, retinopathy, and neuropathy. These are all discussed in detail with the patient.  -His previsit labs show A1c of 7.7% improving from 8.6%.     -   He insists that he has to control diabetes to a1c of 6%. I discussed with him safe target for his a1c is 7.5-8%. He is reluctant.   - Glucose logs and insulin administration records pertaining to this visit,  to be scanned into patient's records.  Recent labs reviewed.   - I have re-counseled the patient on diet management and weight loss  by adopting a  carbohydrate restricted / protein rich  Diet.  - Patient admits there is a room for improvement in his diet and drink choices. -  Suggestion is made for him to avoid simple carbohydrates  from his diet including Cakes, Sweet Desserts / Pastries, Ice Cream, Soda (diet and regular), Sweet Tea, Candies, Chips, Cookies, Store Bought Juices, Alcohol in Excess of  1-2 drinks a day, Artificial Sweeteners, and "Sugar-free" Products. This will help patient to have stable blood glucose profile and potentially avoid unintended weight gain.   - Patient is advised to stick to a routine mealtimes to eat 3 meals  a day and avoid unnecessary snacks ( to snack only to correct hypoglycemia).  - I have approached patient with the following individualized plan to manage diabetes and patient agrees.  -This patient is elderly who lives alone with no support at home.     -#1 goal of treatment in his case will be to avoid hypoglycemia.   -I discussed this concern with him, and reluctantly accepts the plan to avoid tight glycemic profile.    -He is advised to continue on same dose of basal insulin Lantus 32  units nightly,  NovoLog  10 units  3 times a day before meals plus correction, when pre-meal blood glucoses above 70 mg/dL, associated with monitoring of blood glucose 4 times a day-before meals and at bedtime.  -In his situation, continuous glucose monitoring would have been helpful in his care,  however, he insists he does not want to wear a sensor. -He has declined offers for the [employer], Dexcom devices. -Patient is warned not to take insulin without proper monitoring per orders.  -He is encouraged to call clinic for blood glucose levels less than 70 or above 300 mg /dl.  -Due to his renal hx , he is not a suitable candidate for metformin, SGLT2 inhibitors, and declined DPPV inhibitors.  - Patient specific target  for A1c; LDL, HDL, Triglycerides, and  Waist Circumference were discussed in detail.  2)  BP/HTN: he is advised to home monitor blood pressure and report if > 140/90 on 2 separate readings.   He is  advised to continue his current blood pressure medications including Hyzaar 100-25 mg p.o. daily along with his amlodipine 10 mg p.o. Daily.  He complains of bilateral ankle swelling, advised him to hold amlodipine for 2 weeks to monitor progress.    3) Lipids/HPL: His August lipid panel showed controlled LDL  74, he decided to stop pravastatin after reading in the Internet about sinus infections from statins.  I explained statins unlikely to cause sinus infections , he wouldn't reconsider it at this time.  4)  Weight/Diet: CDE consult in progress, exercise, and carbohydrates information provided.  5) Chronic Care/Health Maintenance:  -Patient is on ACEI/ARB and Statin medications and encouraged to continue to follow up with Ophthalmology, Podiatrist at least yearly or according to recommendations, and advised to  stay away from smoking. I have recommended yearly flu vaccine and pneumonia vaccination at least every 5 years; moderate intensity exercise for up to 150 minutes weekly; and  sleep for at least 7 hours a day.  I recommended for him to obtain medical alert device to call for EMS during times of emergency.   - I advised patient to maintain close follow up with Earney Mallet, MD for primary care needs.  - Patient Care Time Today:  25 min, of which >50% was spent in reviewing his  current and  previous labs/studies, previous treatments, and medications doses and developing a plan for long-term care based on the latest recommendations for standards of care.  Rashied Wolgamott participated in the discussions, expressed understanding, and voiced agreement with the above plans.  All questions were answered to his satisfaction. he is encouraged to contact clinic should he have any questions or concerns prior to his return visit.  Follow up plan: -Return in about 4 months (around 07/07/2019),  or include a copy of his labs and glucose logs in his mail, for Follow up with Pre-visit Labs, Meter, and Logs.  Glade Lloyd, MD Phone: [phone]  Fax: [phone]   -  This note was partially dictated with voice recognition software. Similar sounding words can be transcribed inadequately or may not  be corrected upon review.  03/06/2019, 9:52 AM

## 2019-06-06 ENCOUNTER — Other Ambulatory Visit: Payer: Self-pay | Admitting: "Endocrinology

## 2019-06-12 ENCOUNTER — Telehealth: Payer: Self-pay

## 2019-06-12 ENCOUNTER — Telehealth: Payer: Self-pay | Admitting: "Endocrinology

## 2019-06-12 DIAGNOSIS — E1165 Type 2 diabetes mellitus with hyperglycemia: Secondary | ICD-10-CM

## 2019-06-12 DIAGNOSIS — IMO0002 Reserved for concepts with insufficient information to code with codable children: Secondary | ICD-10-CM

## 2019-06-12 MED ORDER — TRUE METRIX BLOOD GLUCOSE TEST VI STRP: ORAL_STRIP | 2 refills | Status: DC

## 2019-06-12 MED ORDER — GLUCOSE BLOOD VI STRP: ORAL_STRIP | 2 refills | Status: AC

## 2019-06-12 NOTE — Telephone Encounter (Signed)
Noah Daniel, CMA  

## 2019-06-12 NOTE — Telephone Encounter (Signed)
OPENED IN ERROR

## 2019-07-04 LAB — COMPLETE METABOLIC PANEL WITH GFR
AG Ratio: 2.2 (calc) (ref 1.0–2.5)
ALT: 20 U/L (ref 9–46)
AST: 19 U/L (ref 10–35)
Albumin: 4 g/dL (ref 3.6–5.1)
Alkaline phosphatase (APISO): 93 U/L (ref 35–144)
BUN/Creatinine Ratio: 15 (calc) (ref 6–22)
BUN: 19 mg/dL (ref 7–25)
CO2: 28 mmol/L (ref 20–32)
Calcium: 10.1 mg/dL (ref 8.6–10.3)
Chloride: 97 mmol/L — ABNORMAL LOW (ref 98–110)
Creat: 1.26 mg/dL — ABNORMAL HIGH (ref 0.70–1.18)
GFR, Est African American: 62 mL/min/{1.73_m2} (ref >=60)
GFR, Est Non African American: 54 mL/min/{1.73_m2} — ABNORMAL LOW (ref >=60)
Globulin: 1.8 g/dL (calc) — ABNORMAL LOW (ref 1.9–3.7)
Glucose, Bld: 373 mg/dL — ABNORMAL HIGH (ref 65–99)
Potassium: 5 mmol/L (ref 3.5–5.3)
Sodium: 133 mmol/L — ABNORMAL LOW (ref 135–146)
Total Bilirubin: 1 mg/dL (ref 0.2–1.2)
Total Protein: 5.8 g/dL — ABNORMAL LOW (ref 6.1–8.1)

## 2019-07-04 LAB — HEMOGLOBIN A1C
Hgb A1c MFr Bld: 7.4 % of total Hgb — ABNORMAL HIGH (ref <5.7)
Mean Plasma Glucose: 166 (calc)
eAG (mmol/L): 9.2 (calc)

## 2019-07-07 ENCOUNTER — Encounter: Payer: Self-pay | Admitting: "Endocrinology

## 2019-07-07 ENCOUNTER — Other Ambulatory Visit: Payer: Self-pay

## 2019-07-07 ENCOUNTER — Ambulatory Visit (INDEPENDENT_AMBULATORY_CARE_PROVIDER_SITE_OTHER): Payer: Medicare PPO | Admitting: "Endocrinology

## 2019-07-07 DIAGNOSIS — I10 Essential (primary) hypertension: Secondary | ICD-10-CM

## 2019-07-07 DIAGNOSIS — E782 Mixed hyperlipidemia: Secondary | ICD-10-CM | POA: Diagnosis not present

## 2019-07-07 DIAGNOSIS — E1165 Type 2 diabetes mellitus with hyperglycemia: Secondary | ICD-10-CM

## 2019-07-07 NOTE — Progress Notes (Signed)
 07/07/2019                                                    Endocrinology Telehealth Visit Follow up Note -During COVID -19 Pandemic  This visit type was conducted due to national recommendations for restrictions regarding the COVID-19 Pandemic  in an effort to limit this patient's exposure and mitigate transmission of the corona virus.  Due to his co-morbid illnesses, Noah Daniel is at  moderate to high risk for complications without adequate follow up.  This format is felt to be most appropriate for him at this time.  I connected with this patient on 07/07/2019   by telephone and verified that I am speaking with the correct person using two identifiers. Noah Daniel, 09/28/1941. he has verbally consented to this visit. All issues noted in this document were discussed and addressed. The format was not optimal for physical exam.     Subjective:    Patient ID: Noah Daniel, male    DOB: 12/06/1940, PCP Jannach, Stephen, MD   Past Medical History:  Diagnosis Date  . BPH (benign prostatic hyperplasia)   . CKD (chronic kidney disease)   . Diabetes mellitus, type II ([facility])   . Diabetic neuropathy ([facility])   . History of kidney cancer   . Hyperlipidemia   . Hypertension   . Osteoarthritis    Past Surgical History:  Procedure Laterality Date  . CARPAL TUNNEL RELEASE    . CATARACT EXTRACTION    . NEPHRECTOMY     Social History   Socioeconomic History  . Marital status: Unknown    Spouse name: Not on file  . Number of children: Not on file  . Years of education: Not on file  . Highest education level: Not on file  Occupational History  . Not on file  Social Needs  . Financial resource strain: Not on file  . Food insecurity    Worry: Not on file    Inability: Not on file  . Transportation needs    Medical: Not on file    Non-medical: Not on file  Tobacco Use  . Smoking status: Former Smoker  . Smokeless tobacco: Never Used  Substance and Sexual Activity  . Alcohol use: No     Alcohol/week: 0.0 standard drinks  . Drug use: No  . Sexual activity: Not on file  Lifestyle  . Physical activity    Days per week: Not on file    Minutes per session: Not on file  . Stress: Not on file  Relationships  . Social connections    Talks on phone: Not on file    Gets together: Not on file    Attends religious service: Not on file    Active member of club or organization: Not on file    Attends meetings of clubs or organizations: Not on file    Relationship status: Not on file  Other Topics Concern  . Not on file  Social History Narrative  . Not on file   Outpatient Encounter Medications as of 07/07/2019  Medication Sig  . amLODipine (NORVASC) 10 MG tablet Take 10 mg by mouth daily.   . aspirin 81 MG tablet Take 81 mg by mouth. Mon, wed, Fri  . BD INSULIN SYRINGE U/F 31G X 5/16" 0.5 ML MISC USE 1 SYRINGE 4 TIMES   DAILY  . blood glucose meter kit and supplies KIT Dispense based on patient and insurance preference. Use up to four times daily as directed. (FOR ICD-10 E11.65)  . Blood Glucose Monitoring Suppl (TRUE METRIX METER) w/Device KIT USE TO TEST BLOOD SUGAR UP TO 4 TIMES A DAY AS DIRECTED  . glucose blood (TRUE METRIX BLOOD GLUCOSE TEST) test strip USE 1 STRIP TO CHECK GLUCOSE UP TO 4 TIMES DAILY AS DIRECTED  Dx : E11.65  . glucose blood test strip DX E11.65  . insulin aspart (NOVOLOG) 100 UNIT/ML injection Use up to 50 units daily as directed (Patient taking differently: Inject 10-16 Units into the skin 3 (three) times daily with meals. Use up to 50 units daily as directed)  . insulin glargine (LANTUS) 100 UNIT/ML injection INJECT 32 UNITS SUBCUTANEOUSLY AT BEDTIME  . Insulin Syringe-Needle U-100 (RELION INSULIN SYR .3CC/29G) 29G X 1/2" 0.3 ML MISC Use to inject insulin 4 times a day.  . losartan-hydrochlorothiazide (HYZAAR) 100-25 MG tablet Take 1 tablet by mouth daily.  . Omega-3 Fatty Acids (FISH OIL) 1200 MG CAPS Take by mouth daily.  . pravastatin (PRAVACHOL)  20 MG tablet Take 20 mg by mouth. Mon, Wed, Fri  . Vitamin D, Cholecalciferol, 1000 UNITS CAPS Take by mouth daily.   No facility-administered encounter medications on file as of 07/07/2019.    ALLERGIES: Allergies  Allergen Reactions  . Statins Other (See Comments)    joint pain   VACCINATION STATUS:  There is no immunization history on file for this patient.  Diabetes He presents for his follow-up diabetic visit. He has type 2 diabetes mellitus. Onset time: He was diagnosed at approximate age of 56 years. His disease course has been improving. There are no hypoglycemic associated symptoms. Pertinent negatives for hypoglycemia include no confusion, headaches, pallor or seizures. Pertinent negatives for diabetes include no chest pain, no fatigue, no polydipsia, no polyphagia, no polyuria and no weakness. There are no hypoglycemic complications. Symptoms are improving. Diabetic complications include peripheral neuropathy. 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 Current diabetic treatment includes intensive insulin program. He is compliant with treatment most of the time. He is following a generally unhealthy diet. He has not had a previous visit with a dietitian. He participates in exercise intermittently. His home blood glucose trend is fluctuating minimally. His breakfast blood glucose range is generally 140-180 mg/dl. His lunch blood glucose range is generally 140-180 mg/dl. His dinner blood glucose range is generally 140-180 mg/dl. His bedtime blood glucose range is generally 140-180 mg/dl. His overall blood glucose range is 140-180 mg/dl. (He reports consistently near target glycemic profile, and A1c of 7.4%.) An ACE inhibitor/angiotensin II receptor blocker is being taken. Eye exam is current.  Hyperlipidemia This is a chronic problem.  The current episode started more than 1 year ago. Exacerbating diseases include diabetes. Pertinent negatives include no chest pain, myalgias or shortness of breath. Current antihyperlipidemic treatment includes statins. Risk factors for coronary artery disease include dyslipidemia, diabetes mellitus, hypertension, male sex and obesity.  Hypertension This is a chronic problem. The current episode started more than 1 year ago. The problem is uncontrolled. Pertinent negatives include no chest pain, headaches, neck pain, palpitations or shortness of breath. Risk factors for coronary artery disease include diabetes mellitus, dyslipidemia, male gender, obesity and smoking/tobacco exposure. Past treatments include angiotensin blockers and diuretics.     Objective:  There were no vitals taken for this visit.  Wt Readings from Last 3 Encounters:  12/08/18 216 lb 12.8 oz (98.3 kg)  08/04/18 212 lb (96.2 kg)  04/23/18 210 lb (95.3 kg)    Lipid Panel     Component Value Date/Time   CHOL 168 09/03/2018   TRIG 172 (A) 09/03/2018   HDL 45 09/03/2018   CHOLHDL 4.3 05/27/2017 1132   VLDL 16 09/28/2016 0859   LDLCALC 94 09/03/2018    Assessment & Plan:   1. Uncontrolled type 2 diabetes mellitus with complication, with long-term current use of insulin ([facility])  - Patient remains at a high risk for more acute and chronic complications of diabetes which include CAD, CVA, CKD, retinopathy, and neuropathy. These are all discussed in detail with the patient.  -He reports near target glycemic profile and A1c of 7.4% improving from 8.6%.      - Glucose logs and insulin administration records pertaining to this visit,  to be scanned into patient's records.  Recent labs reviewed.   - I have re-counseled the patient on diet management and weight loss  by adopting a carbohydrate restricted / protein rich  Diet.   - he  admits there is a room for improvement in his diet and drink choices. -  Suggestion is  made for him to avoid simple carbohydrates  from his diet including Cakes, Sweet Desserts / Pastries, Ice Cream, Soda (diet and regular), Sweet Tea, Candies, Chips, Cookies, Sweet Pastries,  Store Bought Juices, Alcohol in Excess of  1-2 drinks a day, Artificial Sweeteners, Coffee Creamer, and "Sugar-free" Products. This will help patient to have stable blood glucose profile and potentially avoid unintended weight gain.   - Patient is advised to stick to a routine mealtimes to eat 3 meals  a day and avoid unnecessary snacks ( to snack only to correct hypoglycemia).  - I have approached patient with the following individualized plan to manage diabetes and patient agrees.  -This patient is elderly who lives alone with no support at home.     -#1 goal of treatment in his case will be to avoid hypoglycemia.   -I discussed this concern with him, and reluctantly accepts the plan to avoid tight glycemic profile.    -He is advised to continue Lantus at 32 units nightly,  NovoLog  10 units  3 times a day before meals plus correction, when pre-meal blood glucoses above 70 mg/dL, associated with monitoring of blood glucose 4 times a day-before meals and at bedtime.  -In his situation, continuous glucose monitoring would have been helpful in his care,  however, he insists he does not want to wear a sensor. -He has declined offers for the [employer], Dexcom devices. -Patient is warned not to take insulin without proper monitoring per orders.  -He is encouraged to call clinic for blood glucose levels less than 70 or above 300 mg /dl.  -Due to his renal hx , he is not a suitable candidate for metformin, SGLT2 inhibitors, and declined DPPV inhibitors.  - Patient specific target  for A1c; LDL, HDL, Triglycerides, and  Waist Circumference were discussed in detail.  2) BP/HTN: he is advised to home monitor blood pressure and report if > 140/90 on 2 separate readings.    He is advised to continue his  current blood pressure medications including Hyzaar 100-25 mg p.o. daily along with his amlodipine 10 mg p.o. Daily.  He complains of bilateral ankle swelling, advised him to hold amlodipine   for 2 weeks to monitor progress.    3) Lipids/HPL: His August lipid panel showed controlled LDL  74, he decided to stop pravastatin after reading in the Internet about sinus infections from statins.  I explained statins unlikely to cause sinus infections , he wouldn't reconsider it at this time.  4)  Weight/Diet: CDE consult in progress, exercise, and carbohydrates information provided.  5) Chronic Care/Health Maintenance:  -Patient is on ACEI/ARB and Statin medications and encouraged to continue to follow up with Ophthalmology, Podiatrist at least yearly or according to recommendations, and advised to  stay away from smoking. I have recommended yearly flu vaccine and pneumonia vaccination at least every 5 years; moderate intensity exercise for up to 150 minutes weekly; and  sleep for at least 7 hours a day.  I recommended for him to obtain medical alert device to call for EMS during times of emergency.   - I advised patient to maintain close follow up with Earney Mallet, MD for primary care needs.  - Patient Care Time Today:  25 min, of which >50% was spent in  counseling and the rest reviewing his  current and  previous labs/studies, previous treatments, his blood glucose readings, and medications' doses and developing a plan for long-term care based on the latest recommendations for standards of care.   Noah Daniel participated in the discussions, expressed understanding, and voiced agreement with the above plans.  All questions were answered to his satisfaction. he is encouraged to contact clinic should he have any questions or concerns prior to his return visit.   Follow up plan: -Return in about 4 months (around 11/06/2019), or Include his latest labs in his package, for Bring Meter and Logs- A1c in  Office.  Glade Lloyd, MD Phone: [phone]  Fax: [phone]   -  This note was partially dictated with voice recognition software. Similar sounding words can be transcribed inadequately or may not  be corrected upon review.  07/07/2019, 10:32 AM

## 2019-10-20 ENCOUNTER — Other Ambulatory Visit: Payer: Self-pay | Admitting: "Endocrinology

## 2019-11-04 ENCOUNTER — Other Ambulatory Visit: Payer: Self-pay | Admitting: "Endocrinology

## 2019-11-04 DIAGNOSIS — IMO0002 Reserved for concepts with insufficient information to code with codable children: Secondary | ICD-10-CM

## 2019-11-04 DIAGNOSIS — E1165 Type 2 diabetes mellitus with hyperglycemia: Secondary | ICD-10-CM

## 2019-11-09 ENCOUNTER — Ambulatory Visit: Payer: Medicare PPO | Admitting: "Endocrinology

## 2020-01-13 ENCOUNTER — Other Ambulatory Visit: Payer: Self-pay | Admitting: "Endocrinology

## 2020-01-13 DIAGNOSIS — IMO0002 Reserved for concepts with insufficient information to code with codable children: Secondary | ICD-10-CM

## 2020-01-13 DIAGNOSIS — E1165 Type 2 diabetes mellitus with hyperglycemia: Secondary | ICD-10-CM

## 2020-03-26 ENCOUNTER — Other Ambulatory Visit: Payer: Self-pay | Admitting: "Endocrinology

## 2021-01-31 ENCOUNTER — Inpatient Hospital Stay (HOSPITAL_COMMUNITY)
Admission: EM | Admit: 2021-01-31 | Discharge: 2021-02-03 | DRG: 064 | Disposition: A | Discharge status: Home Health | Payer: Medicare FFS | Source: Other Acute Inpatient Hospital | Attending: Internal Medicine | Admitting: Internal Medicine

## 2021-01-31 ENCOUNTER — Encounter (HOSPITAL_COMMUNITY): Payer: Self-pay | Admitting: Internal Medicine

## 2021-01-31 DIAGNOSIS — S065X9A Traumatic subdural hemorrhage with loss of consciousness of unspecified duration, initial encounter: Secondary | ICD-10-CM | POA: Diagnosis not present

## 2021-01-31 DIAGNOSIS — F0390 Unspecified dementia without behavioral disturbance: Secondary | ICD-10-CM | POA: Diagnosis not present

## 2021-01-31 DIAGNOSIS — E1140 Type 2 diabetes mellitus with diabetic neuropathy, unspecified: Secondary | ICD-10-CM | POA: Diagnosis present

## 2021-01-31 DIAGNOSIS — Z905 Acquired absence of kidney: Secondary | ICD-10-CM | POA: Diagnosis not present

## 2021-01-31 DIAGNOSIS — E118 Type 2 diabetes mellitus with unspecified complications: Secondary | ICD-10-CM | POA: Diagnosis not present

## 2021-01-31 DIAGNOSIS — Z888 Allergy status to other drugs, medicaments and biological substances status: Secondary | ICD-10-CM

## 2021-01-31 DIAGNOSIS — R410 Disorientation, unspecified: Secondary | ICD-10-CM | POA: Diagnosis present

## 2021-01-31 DIAGNOSIS — G9341 Metabolic encephalopathy: Secondary | ICD-10-CM | POA: Diagnosis not present

## 2021-01-31 DIAGNOSIS — Z9181 History of falling: Secondary | ICD-10-CM | POA: Diagnosis not present

## 2021-01-31 DIAGNOSIS — Z20822 Contact with and (suspected) exposure to covid-19: Secondary | ICD-10-CM | POA: Diagnosis present

## 2021-01-31 DIAGNOSIS — N182 Chronic kidney disease, stage 2 (mild): Secondary | ICD-10-CM | POA: Diagnosis not present

## 2021-01-31 DIAGNOSIS — E1165 Type 2 diabetes mellitus with hyperglycemia: Secondary | ICD-10-CM | POA: Diagnosis not present

## 2021-01-31 DIAGNOSIS — R296 Repeated falls: Secondary | ICD-10-CM | POA: Diagnosis present

## 2021-01-31 DIAGNOSIS — E782 Mixed hyperlipidemia: Secondary | ICD-10-CM | POA: Diagnosis not present

## 2021-01-31 DIAGNOSIS — I129 Hypertensive chronic kidney disease with stage 1 through stage 4 chronic kidney disease, or unspecified chronic kidney disease: Secondary | ICD-10-CM | POA: Diagnosis present

## 2021-01-31 DIAGNOSIS — Z85528 Personal history of other malignant neoplasm of kidney: Secondary | ICD-10-CM

## 2021-01-31 DIAGNOSIS — Z87891 Personal history of nicotine dependence: Secondary | ICD-10-CM

## 2021-01-31 DIAGNOSIS — I480 Paroxysmal atrial fibrillation: Secondary | ICD-10-CM | POA: Diagnosis present

## 2021-01-31 DIAGNOSIS — I6203 Nontraumatic chronic subdural hemorrhage: Secondary | ICD-10-CM | POA: Diagnosis present

## 2021-01-31 DIAGNOSIS — S065XAA Traumatic subdural hemorrhage with loss of consciousness status unknown, initial encounter: Secondary | ICD-10-CM | POA: Diagnosis present

## 2021-01-31 DIAGNOSIS — E1122 Type 2 diabetes mellitus with diabetic chronic kidney disease: Secondary | ICD-10-CM | POA: Diagnosis present

## 2021-01-31 DIAGNOSIS — R569 Unspecified convulsions: Secondary | ICD-10-CM | POA: Diagnosis present

## 2021-01-31 DIAGNOSIS — Z794 Long term (current) use of insulin: Secondary | ICD-10-CM

## 2021-01-31 DIAGNOSIS — N400 Enlarged prostate without lower urinary tract symptoms: Secondary | ICD-10-CM | POA: Diagnosis not present

## 2021-01-31 DIAGNOSIS — IMO0002 Reserved for concepts with insufficient information to code with codable children: Secondary | ICD-10-CM

## 2021-01-31 DIAGNOSIS — E1110 Type 2 diabetes mellitus with ketoacidosis without coma: Secondary | ICD-10-CM | POA: Diagnosis present

## 2021-01-31 DIAGNOSIS — I10 Essential (primary) hypertension: Secondary | ICD-10-CM | POA: Diagnosis present

## 2021-01-31 LAB — BASIC METABOLIC PANEL
Anion gap: 12 (ref 5–15)
BUN: 15 mg/dL (ref 8–23)
CO2: 23 mmol/L (ref 22–32)
Calcium: 9.8 mg/dL (ref 8.9–10.3)
Chloride: 100 mmol/L (ref 98–111)
Creatinine, Ser: 1.12 mg/dL (ref 0.61–1.24)
GFR, Estimated: >60 mL/min (ref >60)
Glucose, Bld: 271 mg/dL — ABNORMAL HIGH (ref 70–99)
Potassium: 4.3 mmol/L (ref 3.5–5.1)
Sodium: 135 mmol/L (ref 135–145)

## 2021-01-31 LAB — CBC WITH DIFFERENTIAL/PLATELET
Abs Immature Granulocytes: 0.03 10*3/uL (ref 0.00–0.07)
Basophils Absolute: 0.1 10*3/uL (ref 0.0–0.1)
Basophils Relative: 1 %
Eosinophils Absolute: 0.5 10*3/uL (ref 0.0–0.5)
Eosinophils Relative: 6 %
HCT: 39.6 % (ref 39.0–52.0)
Hemoglobin: 13.7 g/dL (ref 13.0–17.0)
Immature Granulocytes: 0 %
Lymphocytes Relative: 11 %
Lymphs Abs: 0.9 10*3/uL (ref 0.7–4.0)
MCH: 33.4 pg (ref 26.0–34.0)
MCHC: 34.6 g/dL (ref 30.0–36.0)
MCV: 96.6 fL (ref 80.0–100.0)
Monocytes Absolute: 0.8 10*3/uL (ref 0.1–1.0)
Monocytes Relative: 10 %
Neutro Abs: 5.7 10*3/uL (ref 1.7–7.7)
Neutrophils Relative %: 72 %
Platelets: 200 10*3/uL (ref 150–400)
RBC: 4.1 MIL/uL — ABNORMAL LOW (ref 4.22–5.81)
RDW: 12.1 % (ref 11.5–15.5)
WBC: 7.9 10*3/uL (ref 4.0–10.5)
nRBC: 0 % (ref 0.0–0.2)

## 2021-01-31 LAB — HEMOGLOBIN A1C
Hgb A1c MFr Bld: 8.8 % — ABNORMAL HIGH (ref 4.8–5.6)
Mean Plasma Glucose: 205.86 mg/dL

## 2021-01-31 LAB — HEPATIC FUNCTION PANEL
ALT: 21 U/L (ref 0–44)
AST: 16 U/L (ref 15–41)
Albumin: 3.1 g/dL — ABNORMAL LOW (ref 3.5–5.0)
Alkaline Phosphatase: 96 U/L (ref 38–126)
Bilirubin, Direct: 0.2 mg/dL (ref 0.0–0.2)
Indirect Bilirubin: 1.3 mg/dL — ABNORMAL HIGH (ref 0.3–0.9)
Total Bilirubin: 1.5 mg/dL — ABNORMAL HIGH (ref 0.3–1.2)
Total Protein: 5.4 g/dL — ABNORMAL LOW (ref 6.5–8.1)

## 2021-01-31 LAB — GLUCOSE, CAPILLARY: Glucose-Capillary: 268 mg/dL — ABNORMAL HIGH (ref 70–99)

## 2021-01-31 MED ORDER — ACETAMINOPHEN 325 MG PO TABS: 650.0000 mg | ORAL_TABLET | Freq: Four times a day (QID) | ORAL | Status: DC | PRN

## 2021-01-31 MED ORDER — INSULIN ASPART 100 UNIT/ML ~~LOC~~ SOLN
0.0000 [IU] | Freq: Three times a day (TID) | SUBCUTANEOUS | Status: DC
  Administered 2021-02-01: 5 [IU] via SUBCUTANEOUS
  Administered 2021-02-01: 11 [IU] via SUBCUTANEOUS
  Administered 2021-02-02: 15 [IU] via SUBCUTANEOUS

## 2021-01-31 MED ORDER — ACETAMINOPHEN 650 MG RE SUPP: 650.0000 mg | Freq: Four times a day (QID) | RECTAL | Status: DC | PRN

## 2021-01-31 MED ORDER — HYDRALAZINE HCL 20 MG/ML IJ SOLN
10.0000 mg | INTRAMUSCULAR | Status: DC | PRN
  Administered 2021-02-01: 10 mg via INTRAVENOUS
  Filled 2021-01-31: qty 1

## 2021-01-31 MED ORDER — LOSARTAN POTASSIUM 50 MG PO TABS
100.0000 mg | ORAL_TABLET | Freq: Every day | ORAL | Status: DC
  Administered 2021-02-01 – 2021-02-03 (×2): 100 mg via ORAL
  Filled 2021-01-31 (×3): qty 2

## 2021-01-31 MED ORDER — INSULIN ASPART 100 UNIT/ML ~~LOC~~ SOLN
0.0000 [IU] | SUBCUTANEOUS | Status: DC
  Administered 2021-01-31: 5 [IU] via SUBCUTANEOUS

## 2021-01-31 MED ORDER — PRAVASTATIN SODIUM 40 MG PO TABS
20.0000 mg | ORAL_TABLET | Freq: Every day | ORAL | Status: DC
  Administered 2021-02-01 – 2021-02-02 (×2): 20 mg via ORAL
  Filled 2021-01-31 (×2): qty 1

## 2021-01-31 MED ORDER — AMLODIPINE BESYLATE 10 MG PO TABS
10.0000 mg | ORAL_TABLET | Freq: Every day | ORAL | Status: DC
  Administered 2021-02-01 – 2021-02-03 (×2): 10 mg via ORAL
  Filled 2021-01-31 (×3): qty 1

## 2021-01-31 MED ORDER — LEVETIRACETAM IN NACL 500 MG/100ML IV SOLN
500.0000 mg | Freq: Two times a day (BID) | INTRAVENOUS | Status: DC
  Administered 2021-02-01 (×3): 500 mg via INTRAVENOUS
  Filled 2021-01-31 (×3): qty 100

## 2021-01-31 MED ORDER — INSULIN GLARGINE 100 UNIT/ML ~~LOC~~ SOLN
5.0000 [IU] | Freq: Two times a day (BID) | SUBCUTANEOUS | Status: DC
  Administered 2021-02-01 (×3): 5 [IU] via SUBCUTANEOUS
  Filled 2021-01-31 (×5): qty 0.05

## 2021-01-31 NOTE — H&P (Signed)
History and Physical    Noah Lips Depascale Jr. ZOX:[id number] DOB: 05/17/1941 DOA: 01/31/2021  PCP: Earney Mallet, MD  Patient coming from: Patient was transferred from [facility].  Chief Complaint: Syncope.  HPI: Noah Daniel. is a 80 y.o. male with history of A. fib, diabetes mellitus type 2, chronic kidney disease, renal cell carcinoma was transferred from [facility] after patient CT head showed subdural hematoma with midline shift.  Patient was brought to the ER patient had a syncopal episode and was found to have markedly elevated blood sugar with elevated anion gap consistent with DKA was started on insulin infusion.  While in the ER patient had a tonic-clonic seizure was given Keppra loading dose.  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  Since patient had a midline shift about 5 mm of on-call neurosurgeon at [facility] was contacted and patient was accepted at [facility].  Before transfer patient blood sugars are improving anion gap was corrected was taken off the insulin infusion.  Last month in March 2022 patient had a fall and had a subdural hematoma was taken to [facility].  At the time patient was placed on Keppra for a week.  Patient used to be on apixaban which has been held.  ED Course: Patient is a direct admit.  Review of Systems: As per HPI, rest all negative.   Past Medical History:  Diagnosis Date  . BPH (benign prostatic hyperplasia)   . CKD (chronic kidney disease)   . Diabetes mellitus, type II ([place])   . Diabetic neuropathy ([place])   . History of kidney cancer   . Hyperlipidemia   . Hypertension   . Osteoarthritis     Past Surgical History:  Procedure Laterality Date  . CARPAL TUNNEL RELEASE    . CATARACT EXTRACTION    . NEPHRECTOMY       reports that he has quit smoking. He has never used smokeless tobacco. He reports that he does not drink alcohol and does not use drugs.  Allergies   Allergen Reactions  . Statins Other (See Comments)    joint pain    History reviewed. No pertinent family history.  Prior to Admission medications   Medication Sig Start Date End Date Taking? Authorizing Provider  amLODipine (NORVASC) 10 MG tablet Take 10 mg by mouth daily.     [provider]  aspirin 81 MG tablet Take 81 mg by mouth. Mon, wed, Fri    [provider]  BD INSULIN SYRINGE U/F 31G X 5/16" 0.5 ML MISC USE 1 SYRINGE 4 TIMES DAILY 11/04/19   Nida, Marella Chimes, MD  blood glucose meter kit and supplies KIT Dispense based on patient and insurance preference. Use up to four times daily as directed. (FOR ICD-10 E11.65) 03/19/17   Cassandria Anger, MD  Blood Glucose Monitoring Suppl (TRUE METRIX METER) w/Device KIT USE TO TEST BLOOD SUGAR UP TO 4 TIMES A DAY AS DIRECTED 12/10/16   Nida, Marella Chimes, MD  glucose blood (TRUE METRIX BLOOD GLUCOSE TEST) test strip USE TO CHECK BLOOD GLUCOSE UP TO 4 TIMES DAILY AS DIRECTED 01/13/20   Nida, Marella Chimes, MD  glucose blood test strip DX E11.65 06/12/19   Nida, Marella Chimes, MD  insulin aspart (NOVOLOG) 100 UNIT/ML injection Use up to 50 units daily as directed Patient taking differently: Inject 10-16 Units into the skin 3 (three) times daily with meals. Use up to 50 units  daily as directed 04/23/18   Cassandria Anger, MD  insulin glargine (LANTUS) 100 UNIT/ML injection INJECT 32 UNITS SUBCUTANEOUSLY AT BEDTIME 04/23/18   Cassandria Anger, MD  Insulin Syringe-Needle U-100 (RELION INSULIN SYR .3CC/29G) 29G X 1/2" 0.3 ML MISC Use to inject insulin 4 times a day. 05/31/17   Cassandria Anger, MD  losartan-hydrochlorothiazide (HYZAAR) 100-25 MG tablet Take 1 tablet by mouth daily.    [provider]  Omega-3 Fatty Acids (FISH OIL) 1200 MG CAPS Take by mouth daily.    [provider]  pravastatin (PRAVACHOL) 20 MG tablet Take 20 mg by mouth. Mon, Wed, Fri    [provider]   Vitamin D, Cholecalciferol, 1000 UNITS CAPS Take by mouth daily.    [provider]    Physical Exam: Constitutional: Moderately built and nourished.  Blood pressure is 150/69 pulse is 80/min. Vitals:   01/31/21 2046  Temp: 97.9 F (36.6 C)  TempSrc: Oral   Eyes: Anicteric no pallor. ENMT: No discharge from the ears eyes nose or mouth. Neck: No JVD appreciated no mass felt. Respiratory: No rhonchi or crepitations. Cardiovascular: S1-S2 heard. Abdomen: Soft nontender bowel sounds present. Musculoskeletal: No edema. Skin: No rash. Neurologic: Alert awake oriented to time place and person moving all extremities. Psychiatric: Appears normal.  Normal affect.   Labs on Admission: I have personally reviewed following labs and imaging studies  CBC: Recent Labs  Lab 01/31/21 2207  WBC 7.9  NEUTROABS 5.7  HGB 13.7  HCT 39.6  MCV 96.6  PLT 578   Basic Metabolic Panel: Recent Labs  Lab 01/31/21 2207  NA 135  K 4.3  CL 100  CO2 23  GLUCOSE 271*  BUN 15  CREATININE 1.12  CALCIUM 9.8   GFR: CrCl cannot be calculated (Unknown ideal weight.). Liver Function Tests: Recent Labs  Lab 01/31/21 2207  AST 16  ALT 21  ALKPHOS 96  BILITOT 1.5*  PROT 5.4*  ALBUMIN 3.1*   No results for input(s): LIPASE, AMYLASE in the last 168 hours. No results for input(s): AMMONIA in the last 168 hours. Coagulation Profile: No results for input(s): INR, PROTIME in the last 168 hours. Cardiac Enzymes: No results for input(s): CKTOTAL, CKMB, CKMBINDEX, TROPONINI in the last 168 hours. BNP (last 3 results) No results for input(s): PROBNP in the last 8760 hours. HbA1C: Recent Labs    01/31/21 2208  HGBA1C 8.8*   CBG: Recent Labs  Lab 01/31/21 2130  GLUCAP 268*   Lipid Profile: No results for input(s): CHOL, HDL, LDLCALC, TRIG, CHOLHDL, LDLDIRECT in the last 72 hours. Thyroid Function Tests: No results for input(s): TSH, T4TOTAL, FREET4, T3FREE, THYROIDAB in the last  72 hours. Anemia Panel: No results for input(s): VITAMINB12, FOLATE, FERRITIN, TIBC, IRON, RETICCTPCT in the last 72 hours. Urine analysis: No results found for: COLORURINE, APPEARANCEUR, LABSPEC, PHURINE, GLUCOSEU, HGBUR, BILIRUBINUR, KETONESUR, PROTEINUR, UROBILINOGEN, NITRITE, LEUKOCYTESUR Sepsis Labs: '@LABRCNTIP' (procalcitonin:4,lacticidven:4) )No results found for this or any previous visit (from the past 240 hour(s)).   Radiological Exams on Admission: No results found.    Assessment/Plan Active Problems:   Uncontrolled type 2 diabetes mellitus with complication, with long-term current use of insulin (HCC)   Essential hypertension, benign   Mixed hyperlipidemia   Subdural hematoma (HCC)    1. Subdural hematoma with midline shift -discussed with on-call neurosurgeon team which will be seeing patient in consult.  We will follow the recommendations. 2. Seizures -patient was witnessed to have tonic-clonic seizure.  Was given  Keppra loading dose I discussed with on-call neurologist Dr. Cheral Marker who advised to keep patient on [place] for now.  Check EEG.  We will follow neurosurgical recommendation. 3. Diabetes mellitus type 2 uncontrolled was in DKA before the transfer.  Was taken off the insulin drip after anion gap are corrected.  Patient during the last admission last month also had a DKA.  Not sure of the exact dose of Lantus and sliding scale patient takes at home.  Presently we will keep patient on Lantus 5 units twice daily with sliding scale coverage as patient suggested so he was taking at home.  Follow hemoglobin A1c. 4. History of paroxysmal atrial fibrillation presently not on anticoagulation secondary to subdural hematoma.  Home medication needs to be verified.  Home medication needs to be refilled. EKG and COVID test are pending.  Since patient had subdural hematoma with seizures and uncontrolled diabetes will need close monitoring for any further worsening in inpatient  status.   DVT prophylaxis: SCDs.  Avoiding anticoagulation due to subdural hematoma. Code Status: Full code. Family Communication: Discussed with patient. Disposition Plan: Home. Consults called: Neurosurgery and discussed with neurologist. Admission status: Inpatient.   Rise Patience MD [facility] Pager [phone].  If 7PM-7AM, please contact night-coverage [email or web address] Password [facility]  01/31/2021, 11:08 PM

## 2021-01-31 NOTE — Progress Notes (Signed)
Notified neurology patient has arrived from [facility].

## 2021-02-01 ENCOUNTER — Inpatient Hospital Stay (HOSPITAL_COMMUNITY): Payer: Medicare FFS

## 2021-02-01 DIAGNOSIS — I10 Essential (primary) hypertension: Secondary | ICD-10-CM

## 2021-02-01 DIAGNOSIS — E1165 Type 2 diabetes mellitus with hyperglycemia: Secondary | ICD-10-CM | POA: Diagnosis not present

## 2021-02-01 DIAGNOSIS — R569 Unspecified convulsions: Secondary | ICD-10-CM | POA: Diagnosis not present

## 2021-02-01 DIAGNOSIS — E118 Type 2 diabetes mellitus with unspecified complications: Secondary | ICD-10-CM | POA: Diagnosis not present

## 2021-02-01 DIAGNOSIS — S065X9A Traumatic subdural hemorrhage with loss of consciousness of unspecified duration, initial encounter: Secondary | ICD-10-CM | POA: Diagnosis not present

## 2021-02-01 LAB — GLUCOSE, CAPILLARY
Glucose-Capillary: 324 mg/dL — ABNORMAL HIGH (ref 70–99)
Glucose-Capillary: 221 mg/dL — ABNORMAL HIGH (ref 70–99)
Glucose-Capillary: 113 mg/dL — ABNORMAL HIGH (ref 70–99)
Glucose-Capillary: 307 mg/dL — ABNORMAL HIGH (ref 70–99)

## 2021-02-01 LAB — SARS CORONAVIRUS 2 (TAT 6-24 HRS): SARS Coronavirus 2: NEGATIVE

## 2021-02-01 NOTE — Progress Notes (Signed)
Inpatient Diabetes Program Recommendations  AACE/ADA: New Consensus Statement on Inpatient Glycemic Control  Target Ranges:  Prepandial:   less than 140 mg/dL      [place] postprandial:   less than 180 mg/dL (1-2 hours)      Critically ill patients:  140 - 180 mg/dL   Results for Noah Daniel, Noah Daniel (MRN [id number]) as of 02/01/2021 10:15  Ref. Range 01/31/2021 21:30 02/01/2021 07:11  Glucose-Capillary Latest Ref Range: 70 - 99 mg/dL 664 (H) 403 (H)  Results for Noah Daniel, Noah Daniel (MRN [id number]) as of 02/01/2021 10:15  Ref. Range 01/31/2021 22:07 01/31/2021 22:08  Glucose Latest Ref Range: 70 - 99 mg/dL 875 (H)   Hemoglobin I4P Latest Ref Range: 4.8 - 5.6 %  8.8 (H)   Review of Glycemic Control  Diabetes history: DM2 Outpatient Diabetes medications: Lantus 32 units QHS, Novolog 10-16 units TID with meals (from home med list; per discharge summary from [facility] on 01/06/21 pt was prescribed Levemir 8 units BID and Humalog 4 units TID with meals) Current orders for Inpatient glycemic control: Lantus 5 units BID, Novolog 0-15 units TID with meals  Inpatient Diabetes Program Recommendations:    Insulin: Please consider adding Novolog 0-5 units QHS for bedtime correction.  If glucose is consistently over 180 mg/dl, please consider increasing Lantus to 8 units BID.  NOTE: Per chart, patient was transferred from [facility] after CT head showed subdural hematoma. Also noted patient had DKA and was on insulin infusion which was stopped prior to transfer to [facility]. Patient also noted to have seizure. In reviewing Care Everywhere, patient was recently at [facility] 12/19/20-01/06/21 after [place], noted to have seizures (en route with EMS) and initially in DKA. Patient was discharged on 01/06/21 on Levemir 8 units BID and Humalog 4 units TID with meals for DM control.   Thanks, Orlando Penner, RN, MSN, CDE Diabetes Coordinator Inpatient Diabetes Program [phone]  (Team Pager from 8am to 5pm)

## 2021-02-01 NOTE — Consult Note (Addendum)
Providing Compassionate, Quality Care - Together   Reason for Consult: Chronic subdural hematoma Referring Physician: Dr. Dolores Hooseellaria [facility])  Noah HoraWilliam Akin Gervasi Montez HagemanJr. is an 80 y.o. male.  HPI: Mr. Jerilynn SomStill is a slightly confused 80 year old gentleman with a history significant for poorly controlled type II diabetes, CKD, BPH, diabetic neuropathy, history of kidney cancer, hyperlipidemia, hypertension, atrial fibrillation (anticoagulation on hold since fall last month), and osteoarthritis. There is no family present at the time of this assessment. He reports some confusion surrounding his recent medical events. He tells me he fell about a month ago and was seen at [facility]. He sustained a subdural hematoma, which was monitored without intervention by their Neurosurgical team. He tells me he was in the hospital for several days at that time, and then discharged to a skilled nursing facility. He was only recently discharged home, where he lives alone, when he had his most recent syncopal episode. [facility] was [facility], where he was found to be in DKA. His head was scanned and showed his chronic subdural hematoma. He had a tonic clonic seizure while in [facility]. Per Dr. Ramond Marrowellario, the patient was prescribed Keppra, but had not taken a dose on 01/30/2021. An attempt was made to transfer the [facility] in order to manage his DKA and have him followed by Neurosurgery. No beds were available and a transfer to [facility]. At present, the patient denies headache, nausea, vomiting, or visual changes. He reports a feeling of generalized weakness, which has been present since his hospitalization last month. Neurosurgery was asked to consult for further evaluation and recommendations.  Past Medical History:  Diagnosis Date  . BPH (benign prostatic hyperplasia)   . CKD (chronic kidney disease)   . Diabetes mellitus, type  II (HCC)   . Diabetic neuropathy (HCC)   . History of kidney cancer   . Hyperlipidemia   . Hypertension   . Osteoarthritis     Past Surgical History:  Procedure Laterality Date  . CARPAL TUNNEL RELEASE    . CATARACT EXTRACTION    . NEPHRECTOMY      History reviewed. No pertinent family history.  Social History:  reports that he has quit smoking. He has never used smokeless tobacco. He reports that he does not drink alcohol and does not use drugs.  Allergies:  Allergies  Allergen Reactions  . Statins Other (See Comments)    joint pain    Medications: I have reviewed the patient's current medications.  Results for orders placed or performed during the hospital encounter of 01/31/21 (from the past 48 hour(s))  Glucose, capillary     Status: Abnormal   Collection Time: 01/31/21  9:30 PM  Result Value Ref Range   Glucose-Capillary 268 (H) 70 - 99 mg/dL    Comment: Glucose reference range applies only to samples taken after fasting for at least 8 hours.  Basic metabolic panel     Status: Abnormal   Collection Time: 01/31/21 10:07 PM  Result Value Ref Range   Sodium 135 135 - 145 mmol/L   Potassium 4.3 3.5 - 5.1 mmol/L   Chloride 100 98 - 111 mmol/L   CO2 23 22 - 32 mmol/L   Glucose, Bld 271 (H) 70 - 99 mg/dL    Comment: Glucose reference range applies only to samples taken after fasting for at least 8 hours.   BUN 15 8 - 23 mg/dL  Creatinine, Ser 1.12 0.61 - 1.24 mg/dL   Calcium 9.8 8.9 - 19.5 mg/dL   GFR, Estimated >09 >32 mL/min    Comment: (NOTE) Calculated using the CKD-EPI Creatinine Equation (2021)    Anion gap 12 5 - 15    Comment: Performed at [facility], [address] [place], [place] [address]  CBC with Differential/Platelet     Status: Abnormal   Collection Time: 01/31/21 10:07 PM  Result Value Ref Range   WBC 7.9 4.0 - 10.5 K/uL   RBC 4.10 (L) 4.22 - 5.81 MIL/uL   Hemoglobin 13.7 13.0 - 17.0 g/dL   HCT 58.0 99.8 - 33.8 %   MCV 96.6 80.0 -  100.0 fL   MCH 33.4 26.0 - 34.0 pg   MCHC 34.6 30.0 - 36.0 g/dL   RDW 25.0 53.9 - 76.7 %   Platelets 200 150 - 400 K/uL   nRBC 0.0 0.0 - 0.2 %   Neutrophils Relative % 72 %   Neutro Abs 5.7 1.7 - 7.7 K/uL   Lymphocytes Relative 11 %   Lymphs Abs 0.9 0.7 - 4.0 K/uL   Monocytes Relative 10 %   Monocytes Absolute 0.8 0.1 - 1.0 K/uL   Eosinophils Relative 6 %   Eosinophils Absolute 0.5 0.0 - 0.5 K/uL   Basophils Relative 1 %   Basophils Absolute 0.1 0.0 - 0.1 K/uL   Immature Granulocytes 0 %   Abs Immature Granulocytes 0.03 0.00 - 0.07 K/uL    Comment: Performed at [facility], [address] [place], [place] [address]  Hepatic function panel     Status: Abnormal   Collection Time: 01/31/21 10:07 PM  Result Value Ref Range   Total Protein 5.4 (L) 6.5 - 8.1 g/dL   Albumin 3.1 (L) 3.5 - 5.0 g/dL   AST 16 15 - 41 U/L   ALT 21 0 - 44 U/L   Alkaline Phosphatase 96 38 - 126 U/L   Total Bilirubin 1.5 (H) 0.3 - 1.2 mg/dL   Bilirubin, Direct 0.2 0.0 - 0.2 mg/dL   Indirect Bilirubin 1.3 (H) 0.3 - 0.9 mg/dL    Comment: Performed at [facility], [address] [place], [place] [address]  Hemoglobin A1c     Status: Abnormal   Collection Time: 01/31/21 10:08 PM  Result Value Ref Range   Hgb A1c MFr Bld 8.8 (H) 4.8 - 5.6 %    Comment: (NOTE) Pre diabetes:          5.7%-6.4%  Diabetes:              >6.4%  Glycemic control for   <7.0% adults with diabetes    Mean Plasma Glucose 205.86 mg/dL    Comment: Performed at [facility], [address] [place], [place] [address]  SARS CORONAVIRUS 2 (TAT 6-24 HRS) Nasopharyngeal Nasopharyngeal Swab     Status: None   Collection Time: 02/01/21  2:01 AM   Specimen: Nasopharyngeal Swab  Result Value Ref Range   SARS Coronavirus 2 NEGATIVE NEGATIVE    Comment: (NOTE) SARS-CoV-2 target nucleic acids are NOT DETECTED.  The SARS-CoV-2 RNA is generally detectable in upper and lower respiratory specimens during the acute phase of  infection. Negative results do not preclude SARS-CoV-2 infection, do not rule out co-infections with other pathogens, and should not be used as the sole basis for treatment or other patient management decisions. Negative results must be combined with clinical observations, patient history,  and epidemiological information. The expected result is Negative.  Fact Sheet for Patients: HairSlick.no  Fact Sheet for Healthcare Providers: quierodirigir.com  This test is not yet approved or cleared by the [place] FDA and  has been authorized for detection and/or diagnosis of SARS-CoV-2 by FDA under an Emergency Use Authorization (EUA). This EUA will remain  in effect (meaning this test can be used) for the duration of the COVID-19 declaration under Se ction 564(b)(1) of the Act, 21 U.S.C. section 360bbb-3(b)(1), unless the authorization is terminated or revoked sooner.  Performed at [facility], [address], [place], [place] [address]   Glucose, capillary     Status: Abnormal   Collection Time: 02/01/21  7:11 AM  Result Value Ref Range   Glucose-Capillary 324 (H) 70 - 99 mg/dL    Comment: Glucose reference range applies only to samples taken after fasting for at least 8 hours.    CT HEAD WITHOUT CONTRAST  TECHNIQUE: Contiguous axial images were obtained from the base of the skull through the vertex without intravenous contrast.  COMPARISON: None.  FINDINGS: Brain: Low-density subdural fluid collection noted over the right cerebral hemisphere most notable in the frontal region measuring up to 13 mm in thickness. 5 mm of right to left midline shift. No hydrocephalus. No acute hemorrhage or acute infarction. There is atrophy and chronic small vessel disease changes. Vascular: No hyperdense vessel or unexpected calcification. Skull: No acute calvarial abnormality. 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 Electronically Signed By: Charlett Nose M.D. On: 01/30/2021 19:24   CT HEAD WO CONTRAST  Result Date: 02/01/2021 CLINICAL DATA:  Seizure EXAM: CT HEAD WITHOUT CONTRAST TECHNIQUE: Contiguous axial images were obtained from the base of the skull through the vertex without intravenous contrast. COMPARISON:  None. FINDINGS: Brain: There is a low-density subdural fluid collections seen overlying the right frontal and parietal lobes measuring up to 13 mm in thickness compatible with chronic subdural hematoma. 5 mm of right to left midline shift. No hydrocephalus. No hemorrhage or acute infarction. Old left occipital infarct. Vascular: No hyperdense vessel or unexpected calcification. Skull: No acute calvarial abnormality. Sinuses/Orbits: No acute findings Other: None IMPRESSION: Chronic right subdural hematoma measuring up to 13 mm overlying the right frontal lobe. 5 mm of right to left midline shift. Old left occipital infarct. Electronically Signed   By: Charlett Nose M.D.   On: 02/01/2021 00:44   EEG adult  Result Date: 02/01/2021 Charlsie Quest, MD     02/01/2021  9:04 AM Patient Name: Noah Daniel Weare Jr. MRN: [id number] Epilepsy Attending: Charlsie Quest Referring Physician/Provider: Dr. Midge Minium Date: 01/31/2021 Duration: 22.04 mins Patient history: 80 year old male presented with syncope. While in the ER, patient had a generalized tonic-clonic seizure.  EEG to evaluate for seizures. Level of alertness: Awake AEDs during EEG study: LEV Technical aspects: This EEG study was done with scalp electrodes positioned according to the 10-20 International system of electrode placement. Electrical activity was acquired at a sampling rate of 500Hz  and reviewed with a high frequency filter of 70Hz  and a low frequency filter of 1Hz . EEG data were recorded  continuously and digitally stored. Description: The posterior dominant rhythm consists of 8-9 Hz activity of moderate voltage (25-35 uV) seen predominantly in posterior head regions, symmetric and reactive to eye opening and eye  closing.  Hyperventilation and photic stimulation were not performed.   IMPRESSION: This study is within normal limits. No seizures or epileptiform discharges were seen throughout the recording. Priyanka Annabelle Harman    Review of Systems  Constitutional: Positive for malaise/fatigue. Negative for chills, diaphoresis, fever and weight loss.  HENT: Positive for hearing loss. Negative for congestion, sinus pain and sore throat.   Eyes: Negative for blurred vision, double vision and photophobia.  Respiratory: Negative for cough, sputum production, shortness of breath and wheezing.   Cardiovascular: Negative for chest pain, palpitations, orthopnea and claudication.  Gastrointestinal: Negative for abdominal pain, constipation, diarrhea, nausea and vomiting.  Genitourinary: Positive for frequency.  Musculoskeletal: Positive for falls and joint pain. Negative for back pain and neck pain.  Neurological: Positive for seizures, loss of consciousness and weakness. Negative for dizziness, tingling, tremors, sensory change, speech change, focal weakness and headaches.  Endo/Heme/Allergies: Negative.   Psychiatric/Behavioral: Negative.    Blood pressure (!) 147/77, pulse 64, temperature 98.3 F (36.8 C), temperature source Oral, resp. rate 14, SpO2 95 %. Estimated body mass index is 30.24 kg/m as calculated from the following:   Height as of 12/08/18: 5\' 11"  (1.803 m).   Weight as of 12/08/18: 98.3 kg.  Physical Exam HENT:     Head: Normocephalic and atraumatic.     Nose: Nose normal. No congestion.     Mouth/Throat:     Mouth: Mucous membranes are moist.     Pharynx: Oropharynx is clear.  Eyes:     Extraocular Movements: Extraocular movements intact.     Conjunctiva/sclera:  Conjunctivae normal.     Pupils: Pupils are equal, round, and reactive to light.  Cardiovascular:     Rate and Rhythm: Normal rate.  Pulmonary:     Effort: Pulmonary effort is normal. No respiratory distress.  Abdominal:     General: There is no distension.     Palpations: Abdomen is soft.  Musculoskeletal:        General: Normal range of motion.     Cervical back: Normal range of motion and neck supple.  Skin:    General: Skin is warm and dry.     Capillary Refill: Capillary refill takes less than 2 seconds.     Comments: Multiple skin tears, abrasions  Neurological:     Mental Status: He is alert and oriented to person, place, and time.     Motor: Motor function is intact.     Coordination: Finger-Nose-Finger Test abnormal. Impaired rapid alternating movements.     Comments: HOH  Psychiatric:        Attention and Perception: Attention normal.        Mood and Affect: Mood and affect normal.        Speech: Speech normal.        Behavior: Behavior is cooperative.        Thought Content: Thought content normal.        Cognition and Memory: Cognition is impaired. He exhibits impaired recent memory.     Assessment/Plan: The patient was transferred to [facility] to manage his DKA. His chronic right-sided subdural hematoma remains unchanged in follow up imaging from this morning. No seizure activity identified on EEG from this morning. No Neurosurgical intervention is recommended at this time. Recommend continuing Keppra as patient had a witnessed seizure at [facility]. Continue to hold anticoagulation at this time. Patient should follow up with [facility] Neurosurgery following discharge from [facility]. Neurosurgery will sign off at this  time. Please re-consult if we can be of further assistance.  Val Eagle, DNP, AGNP-C Nurse Practitioner  [facility] [address], [address], [place], [place] [address] P: [phone]    F:  [phone]  02/01/2021, 11:04 AM  I have reviewed the patient's follow-up head CT performed at [facility]  He has a chronic right subdural hematoma with mild mass-effect.  It is without significant change from his previous study.  There is no need for urgent intervention.  He can follow-up with his neurosurgeon at [facility] who has been following this.

## 2021-02-01 NOTE — Progress Notes (Signed)
EEG complete - results pending 

## 2021-02-01 NOTE — Procedures (Signed)
Patient Name: Noah Daniel.  MRN: [id number]  Epilepsy Attending: Charlsie Quest  Referring Physician/Provider: Dr. Midge Minium Date: 01/31/2021 Duration: 22.04 mins  Patient history: 80 year old male presented with syncope. While in the ER, patient had a generalized tonic-clonic seizure.  EEG to evaluate for seizures.  Level of alertness: Awake  AEDs during EEG study: LEV  Technical aspects: This EEG study was done with scalp electrodes positioned according to the 10-20 International system of electrode placement. Electrical activity was acquired at a sampling rate of 500Hz  and reviewed with a high frequency filter of 70Hz  and a low frequency filter of 1Hz . EEG data were recorded continuously and digitally stored.   Description: The posterior dominant rhythm consists of 8-9 Hz activity of moderate voltage (25-35 uV) seen predominantly in posterior head regions, symmetric and reactive to eye opening and eye closing.  Hyperventilation and photic stimulation were not performed.     IMPRESSION: This study is within normal limits. No seizures or epileptiform discharges were seen throughout the recording.  Jillienne Egner 

## 2021-02-01 NOTE — Progress Notes (Signed)
Foley removed. TOV started. °

## 2021-02-01 NOTE — Care Management Obs Status (Signed)
MEDICARE OBSERVATION STATUS NOTIFICATION   Patient Details  Name: Noah Chavarria Bialas Jr. MRN: [id number] Date of Birth: [birth year]   Medicare Observation Status Notification Given:  Yes    Kermit Balo, RN 02/01/2021, 4:15 PM

## 2021-02-01 NOTE — Progress Notes (Signed)
Patient not available at moment, going for CT.  Tech will check back for availability.

## 2021-02-01 NOTE — Care Management CC44 (Signed)
Condition Code 44 Documentation Completed  Patient Details  Name: Noah Sahlin Lombardo Jr. MRN: [id number] Date of Birth: 11/19/1940   Condition Code 44 given:  Yes Patient signature on Condition Code 44 notice:  Yes Documentation of 2 MD's agreement:  Yes Code 44 added to claim:  Yes    Kermit Balo, RN 02/01/2021, 4:15 PM

## 2021-02-01 NOTE — Progress Notes (Signed)
PROGRESS NOTE    Noah Hora Terhune Jr.  XBJ:[id number] DOB: 1941-10-10 DOA: 01/31/2021 PCP: Alinda Deem, MD    No chief complaint on file.   Brief Narrative: [age] year old gentleman prior history of poorly controlled type 2 diabetes, hyperlipidemia, hypertension and atrial fibrillation, anticoagulation on hold since last month and osteoarthritis with recurrent falls was seen at [facility] for a subdural hematoma.  He was later discharged to a skilled rehab facility.  He was recently discharged home and had another fall and was found to have subdural hematoma and in the ED at [facility]. he  Also had a tonic-clonic seizure in ED. Marland Kitchen Patient was transferred to [facility] for further evaluation and neurosurgery consulted. Assessment & Plan:   Active Problems:   Uncontrolled type 2 diabetes mellitus with complication, with long-term current use of insulin (HCC)   Essential hypertension, benign   Mixed hyperlipidemia   Subdural hematoma (HCC)   Subdural hematoma Patient has a chronic right-sided subdural hematoma remains unchanged in the follow-up imaging this morning. EEG done, no seizure activity on identified on EEG. Neurosurgery consulted and recommended no neurosurgical intervention at this time.  Continue with Keppra.  Hold anticoagulation.  Therapy evaluations pending.     Diabetes mellitus CBG (last 3)  Recent Labs    01/31/21 2130 02/01/21 0711 02/01/21 1238  GLUCAP 268* 324* 221*  Uncontrolled, dependent Continue with Lantus and sliding scale insulin.   Hyperlipidemia Continue with pravastatin.    Hypertension Suboptimally controlled continue with amlodipine, Cozaar and as needed hydralazine.        DVT prophylaxis: SCDs Code Status: Full code Family Communication: None at bedside, called his sister and left a message on the phone. Disposition:   Status is: Observation  The patient will require care spanning > 2  midnights and should be moved to inpatient because: Unsafe d/c plan  Dispo: The patient is from: Home              Anticipated d/c is to: pending              Patient currently is not medically stable to d/c.   Difficult to place patient No       Consultants:   Neuro surgery.   Procedures: EEG  Antimicrobials:  NONE.   Subjective: Pt confused, but able to tell me his name. Denies any pain or nausea, vomiting or abd pain.  He is requesting for appt to follow up with his doctor.   Objective: Vitals:   02/01/21 0747 02/01/21 0805 02/01/21 0853 02/01/21 1242  BP: (!) 174/87 (!) 174/87 (!) 147/77 140/85  Pulse: 64   82  Resp: 14   18  Temp: 98.3 F (36.8 C)   97.6 F (36.4 C)  TempSrc: Oral   Oral  SpO2: 95%   94%    Intake/Output Summary (Last 24 hours) at 02/01/2021 1523 Last data filed at 02/01/2021 1000 Gross per 24 hour  Intake --  Output 1725 ml  Net -1725 ml   There were no vitals filed for this visit.  Examination:  General exam: Appears calm and comfortable  Respiratory system: Clear to auscultation. Respiratory effort normal. Cardiovascular system: S1 & S2 heard, RRR.  No pedal edema. Gastrointestinal system: Abdomen is nondistended, soft and nontender.  Normal bowel sounds heard. Central nervous system: Alert ., confused Extremities: Symmetric 5 x 5 power. Skin: No rashes, lesions or ulcers Psychiatry: Mood & affect appropriate.     Data  Reviewed: I have personally reviewed following labs and imaging studies  CBC: Recent Labs  Lab 01/31/21 2207  WBC 7.9  NEUTROABS 5.7  HGB 13.7  HCT 39.6  MCV 96.6  PLT 200    Basic Metabolic Panel: Recent Labs  Lab 01/31/21 2207  NA 135  K 4.3  CL 100  CO2 23  GLUCOSE 271*  BUN 15  CREATININE 1.12  CALCIUM 9.8    GFR: CrCl cannot be calculated (Unknown ideal weight.).  Liver Function Tests: Recent Labs  Lab 01/31/21 2207  AST 16  ALT 21  ALKPHOS 96  BILITOT 1.5*  PROT 5.4*  ALBUMIN  3.1*    CBG: Recent Labs  Lab 01/31/21 2130 02/01/21 0711 02/01/21 1238  GLUCAP 268* 324* 221*     Recent Results (from the past 240 hour(s))  SARS CORONAVIRUS 2 (TAT 6-24 HRS) Nasopharyngeal Nasopharyngeal Swab     Status: None   Collection Time: 02/01/21  2:01 AM   Specimen: Nasopharyngeal Swab  Result Value Ref Range Status   SARS Coronavirus 2 NEGATIVE NEGATIVE Final    Comment: (NOTE) SARS-CoV-2 target nucleic acids are NOT DETECTED.  The SARS-CoV-2 RNA is generally detectable in upper and lower respiratory specimens during the acute phase of infection. Negative results do not preclude SARS-CoV-2 infection, do not rule out co-infections with other pathogens, and should not be used as the sole basis for treatment or other patient management decisions. Negative results must be combined with clinical observations, patient history, and epidemiological information. The expected result is Negative.  Fact Sheet for Patients: HairSlick.no  Fact Sheet for Healthcare Providers: quierodirigir.com  This test is not yet approved or cleared by the [place] FDA and  has been authorized for detection and/or diagnosis of SARS-CoV-2 by FDA under an Emergency Use Authorization (EUA). This EUA will remain  in effect (meaning this test can be used) for the duration of the COVID-19 declaration under Se ction 564(b)(1) of the Act, 21 U.S.C. section 360bbb-3(b)(1), unless the authorization is terminated or revoked sooner.  Performed at [facility], [address], [place], [place] [address]          Radiology Studies: CT HEAD WO CONTRAST  Result Date: 02/01/2021 CLINICAL DATA:  Seizure EXAM: CT HEAD WITHOUT CONTRAST TECHNIQUE: Contiguous axial images were obtained from the base of the skull through the vertex without intravenous contrast. COMPARISON:  None. FINDINGS: Brain: There is a low-density subdural fluid  collections seen overlying the right frontal and parietal lobes measuring up to 13 mm in thickness compatible with chronic subdural hematoma. 5 mm of right to left midline shift. No hydrocephalus. No hemorrhage or acute infarction. Old left occipital infarct. Vascular: No hyperdense vessel or unexpected calcification. Skull: No acute calvarial abnormality. Sinuses/Orbits: No acute findings Other: None IMPRESSION: Chronic right subdural hematoma measuring up to 13 mm overlying the right frontal lobe. 5 mm of right to left midline shift. Old left occipital infarct. Electronically Signed   By: Charlett Nose M.D.   On: 02/01/2021 00:44   EEG adult  Result Date: 02/01/2021 Charlsie Quest, MD     02/01/2021  9:04 AM Patient Name: Noah Hora Niess Jr. MRN: [id number] Epilepsy Attending: Charlsie Quest Referring Physician/Provider: Dr. Midge Minium Date: 01/31/2021 Duration: 22.04 mins Patient history: 80 year old male presented with syncope. While in the ER, patient had a generalized tonic-clonic seizure.  EEG to evaluate for seizures. Level of alertness: Awake AEDs during EEG study: LEV Technical aspects: This EEG  study was done with scalp electrodes positioned according to the 10-20 International system of electrode placement. Electrical activity was acquired at a sampling rate of 500Hz  and reviewed with a high frequency filter of 70Hz  and a low frequency filter of 1Hz . EEG data were recorded continuously and digitally stored. Description: The posterior dominant rhythm consists of 8-9 Hz activity of moderate voltage (25-35 uV) seen predominantly in posterior head regions, symmetric and reactive to eye opening and eye closing.  Hyperventilation and photic stimulation were not performed.   IMPRESSION: This study is within normal limits. No seizures or epileptiform discharges were seen throughout the recording. Priyanka        Scheduled Meds: . amLODipine  10 mg Oral Daily  . insulin aspart   0-15 Units Subcutaneous TID WC  . insulin glargine  5 Units Subcutaneous BID  . losartan  100 mg Oral Daily  . pravastatin  20 mg Oral q1800   Continuous Infusions: . levETIRAcetam 500 mg (02/01/21 1245)     LOS: 1 day       , MD [facility]   To contact the attending provider between 7A-7P or the covering provider during after hours 7P-7A, please log into the web site [email or web address] and access using universal [place] password for that web site. If you do not have the password, please call the hospital operator.  02/01/2021, 3:23 PM

## 2021-02-02 DIAGNOSIS — F0390 Unspecified dementia without behavioral disturbance: Secondary | ICD-10-CM | POA: Diagnosis present

## 2021-02-02 DIAGNOSIS — N182 Chronic kidney disease, stage 2 (mild): Secondary | ICD-10-CM | POA: Diagnosis present

## 2021-02-02 DIAGNOSIS — Z87891 Personal history of nicotine dependence: Secondary | ICD-10-CM | POA: Diagnosis not present

## 2021-02-02 DIAGNOSIS — Z888 Allergy status to other drugs, medicaments and biological substances status: Secondary | ICD-10-CM | POA: Diagnosis not present

## 2021-02-02 DIAGNOSIS — N400 Enlarged prostate without lower urinary tract symptoms: Secondary | ICD-10-CM | POA: Diagnosis present

## 2021-02-02 DIAGNOSIS — I10 Essential (primary) hypertension: Secondary | ICD-10-CM | POA: Diagnosis not present

## 2021-02-02 DIAGNOSIS — E1122 Type 2 diabetes mellitus with diabetic chronic kidney disease: Secondary | ICD-10-CM | POA: Diagnosis present

## 2021-02-02 DIAGNOSIS — E1140 Type 2 diabetes mellitus with diabetic neuropathy, unspecified: Secondary | ICD-10-CM | POA: Diagnosis present

## 2021-02-02 DIAGNOSIS — E782 Mixed hyperlipidemia: Secondary | ICD-10-CM | POA: Diagnosis present

## 2021-02-02 DIAGNOSIS — Z794 Long term (current) use of insulin: Secondary | ICD-10-CM | POA: Diagnosis not present

## 2021-02-02 DIAGNOSIS — S065X9A Traumatic subdural hemorrhage with loss of consciousness of unspecified duration, initial encounter: Secondary | ICD-10-CM | POA: Diagnosis not present

## 2021-02-02 DIAGNOSIS — I129 Hypertensive chronic kidney disease with stage 1 through stage 4 chronic kidney disease, or unspecified chronic kidney disease: Secondary | ICD-10-CM | POA: Diagnosis present

## 2021-02-02 DIAGNOSIS — R410 Disorientation, unspecified: Secondary | ICD-10-CM | POA: Diagnosis present

## 2021-02-02 DIAGNOSIS — I6203 Nontraumatic chronic subdural hemorrhage: Secondary | ICD-10-CM | POA: Diagnosis present

## 2021-02-02 DIAGNOSIS — R569 Unspecified convulsions: Secondary | ICD-10-CM | POA: Diagnosis present

## 2021-02-02 DIAGNOSIS — Z20822 Contact with and (suspected) exposure to covid-19: Secondary | ICD-10-CM | POA: Diagnosis present

## 2021-02-02 DIAGNOSIS — R296 Repeated falls: Secondary | ICD-10-CM | POA: Diagnosis present

## 2021-02-02 DIAGNOSIS — I480 Paroxysmal atrial fibrillation: Secondary | ICD-10-CM | POA: Diagnosis present

## 2021-02-02 DIAGNOSIS — E1110 Type 2 diabetes mellitus with ketoacidosis without coma: Secondary | ICD-10-CM | POA: Diagnosis present

## 2021-02-02 DIAGNOSIS — Z905 Acquired absence of kidney: Secondary | ICD-10-CM | POA: Diagnosis not present

## 2021-02-02 DIAGNOSIS — E118 Type 2 diabetes mellitus with unspecified complications: Secondary | ICD-10-CM | POA: Diagnosis not present

## 2021-02-02 DIAGNOSIS — Z9181 History of falling: Secondary | ICD-10-CM | POA: Diagnosis not present

## 2021-02-02 DIAGNOSIS — G9341 Metabolic encephalopathy: Secondary | ICD-10-CM | POA: Diagnosis present

## 2021-02-02 DIAGNOSIS — Z85528 Personal history of other malignant neoplasm of kidney: Secondary | ICD-10-CM | POA: Diagnosis not present

## 2021-02-02 LAB — BASIC METABOLIC PANEL
Anion gap: 15 (ref 5–15)
BUN: 16 mg/dL (ref 8–23)
CO2: 20 mmol/L — ABNORMAL LOW (ref 22–32)
Calcium: 9.7 mg/dL (ref 8.9–10.3)
Chloride: 98 mmol/L (ref 98–111)
Creatinine, Ser: 1.31 mg/dL — ABNORMAL HIGH (ref 0.61–1.24)
GFR, Estimated: 55 mL/min — ABNORMAL LOW (ref >60)
Glucose, Bld: 498 mg/dL — ABNORMAL HIGH (ref 70–99)
Potassium: 4.6 mmol/L (ref 3.5–5.1)
Sodium: 133 mmol/L — ABNORMAL LOW (ref 135–145)

## 2021-02-02 LAB — VITAMIN B12: Vitamin B-12: 370 pg/mL (ref 180–914)

## 2021-02-02 LAB — GLUCOSE, CAPILLARY
Glucose-Capillary: 363 mg/dL — ABNORMAL HIGH (ref 70–99)
Glucose-Capillary: 457 mg/dL — ABNORMAL HIGH (ref 70–99)
Glucose-Capillary: 433 mg/dL — ABNORMAL HIGH (ref 70–99)
Glucose-Capillary: 208 mg/dL — ABNORMAL HIGH (ref 70–99)

## 2021-02-02 LAB — TSH: TSH: 4.058 u[IU]/mL (ref 0.350–4.500)

## 2021-02-02 MED ORDER — INSULIN GLARGINE 100 UNIT/ML ~~LOC~~ SOLN
10.0000 [IU] | Freq: Two times a day (BID) | SUBCUTANEOUS | Status: DC
  Administered 2021-02-02 – 2021-02-03 (×2): 10 [IU] via SUBCUTANEOUS
  Filled 2021-02-02 (×3): qty 0.1

## 2021-02-02 MED ORDER — INSULIN ASPART 100 UNIT/ML ~~LOC~~ SOLN
0.0000 [IU] | Freq: Three times a day (TID) | SUBCUTANEOUS | Status: DC
  Administered 2021-02-02: 20 [IU] via SUBCUTANEOUS
  Administered 2021-02-03: 7 [IU] via SUBCUTANEOUS
  Administered 2021-02-03: 11 [IU] via SUBCUTANEOUS

## 2021-02-02 MED ORDER — INSULIN ASPART 100 UNIT/ML ~~LOC~~ SOLN
2.0000 [IU] | Freq: Three times a day (TID) | SUBCUTANEOUS | Status: DC
  Administered 2021-02-02 – 2021-02-03 (×3): 2 [IU] via SUBCUTANEOUS

## 2021-02-02 MED ORDER — METOPROLOL TARTRATE 25 MG PO TABS
25.0000 mg | ORAL_TABLET | Freq: Two times a day (BID) | ORAL | Status: DC
  Administered 2021-02-02 – 2021-02-03 (×3): 25 mg via ORAL
  Filled 2021-02-02 (×2): qty 1

## 2021-02-02 MED ORDER — LEVETIRACETAM 500 MG PO TABS
1000.0000 mg | ORAL_TABLET | Freq: Two times a day (BID) | ORAL | Status: DC
  Administered 2021-02-02 – 2021-02-03 (×3): 1000 mg via ORAL
  Filled 2021-02-02 (×3): qty 2

## 2021-02-02 MED ORDER — HALOPERIDOL LACTATE 5 MG/ML IJ SOLN
2.5000 mg | Freq: Once | INTRAMUSCULAR | Status: AC
  Administered 2021-02-02: 2.5 mg via INTRAVENOUS
  Filled 2021-02-02: qty 1

## 2021-02-02 MED ORDER — AMANTADINE HCL 100 MG PO CAPS
100.0000 mg | ORAL_CAPSULE | Freq: Two times a day (BID) | ORAL | Status: DC
  Administered 2021-02-02 – 2021-02-03 (×3): 100 mg via ORAL
  Filled 2021-02-02 (×5): qty 1

## 2021-02-02 NOTE — TOC Initial Note (Addendum)
Transition of Care (TOC) - Initial/Assessment Note    Patient Details  Name: Noah Jarboe Kusek Jr. MRN: [id number] Date of Birth: [birth year]  Transition of [facility]) CM/SW Contact:    Pollie Friar, RN Phone Number: 02/02/2021, 8:48 AM  Clinical Narrative:                 CM met yesterday with the patient, his sister and BIL. Patient was recently in SNF rehab and discharged home with caregivers. Per sister pt is unsure about he caregivers. Sister to talk with him about continuing their service.  Asked MD for PT/OT consult. Pt with some cognitive deficits during the discussion. Pt has been driving per him and doing his own meds. Pt states he takes his meds as prescribed but pt came to the hospital in DKA. Sister states she is not sure he is taking the meds correctly but also they were not given good directions on meds after d/c from the rehab.  TOC following for PT/OT recommendations and d/c needs.   1530: [place] arranged through [facility] in [place], [place].   Expected Discharge Plan: [place] Barriers to Discharge: Continued Medical Work up   Patient Goals and CMS Choice        Expected Discharge Plan and Services Expected Discharge Plan: [place]   Discharge Planning Services: CM Consult   Living arrangements for the past 2 months: Single Family Home                                      Prior Living Arrangements/Services Living arrangements for the past 2 months: Single Family Home Lives with:: Self Patient language and need for interpreter reviewed:: Yes Do you feel safe going back to the place where you live?: Yes      Need for Family Participation in Patient Care: Yes (Comment)   Current home services: DME (cane, shower seat) Criminal Activity/Legal Involvement Pertinent to Current Situation/Hospitalization: No - Comment as needed  Activities of Daily Living      Permission Sought/Granted                   Emotional Assessment Appearance:: Appears stated age Attitude/Demeanor/Rapport: Engaged Affect (typically observed): Accepting Orientation: : Oriented to Self,Oriented to Place   Psych Involvement: No (comment)  Admission diagnosis:  Subdural hematoma ([place]) [S06.5X9A] Patient Active Problem List   Diagnosis Date Noted  . Subdural hematoma ([place]) 01/31/2021  . Uncontrolled type 2 diabetes mellitus with hyperglycemia ([place]) 12/19/2017  . Uncontrolled type 2 diabetes mellitus with complication, with long-term current use of insulin ([place]) 09/19/2015  . Essential hypertension, benign 09/19/2015  . Mixed hyperlipidemia 09/19/2015   PCP:  Earney Mallet, MD Pharmacy:   [facility], [place] [address] [place] [address] Phone: [phone] Fax: [facility] [address] [place] [address] Phone: [phone] Fax: [phone]  [facility] [place], [place] [address] Phone: [phone] Fax: [phone]     Social Determinants of Health (SDOH) Interventions    Readmission Risk Interventions No flowsheet data found.

## 2021-02-02 NOTE — Evaluation (Signed)
Occupational Therapy Evaluation Patient Details Name: Noah Borum Walters Jr. MRN: [id number] DOB: 03/20/1941 Today's Date: 02/02/2021    History of Present Illness 80 y.o. male with PMH of poorly controlled DM2, HTN, afib with anticoagulation on hold since last month, OA, recurrent falls  presents to [facility] on 01/31/2021 as a transfer from [facility] with CT head demonstrating SDH with midline shift. Pt was in DKA in [place] and also experienced seizure in ED. EEG with no seizure activity noted.  Neurosurgery recommending Keppra.   Clinical Impression   PTA patient reports independent with ADls, iADLs using RW for mobility, living alone but has caregivers that assist.  Pt poor historian, but per chart review and speaking to case management patient completing meds and [place] driving.  Patient admitted for above and limited by problem list below, including impaired cognition, decreased activity tolerance and impaired balance.  He is oriented to self and time, follows simple commands with increased time but difficulty attending to and following multiple step commands or problem solving.  He requires min assist +2 for safety with transfers and mobility in room, up to mod assist for ADLs.  Requires min cueing to sequence and locate items at sink to wash hands.  He is highly impulsive and a high risk for falls.  He will benefit from continued OT services while admitted and after dc at [facility] level, IF he can have 24/7 support and assist with ALL medications and IADLs (driving, cooking, cleaning). If unable to get 24/7 assist, pt will need SNF.  Will follow.     Follow Up Recommendations  Home health OT;Supervision/Assistance - 24 hour (If doesn't have 24/7 assist will need SNF)    Equipment Recommendations  None recommended by OT    Recommendations for Other Services Speech consult     Precautions / Restrictions Precautions Precautions: Fall Precaution Comments: seizures Restrictions Weight Bearing  Restrictions: No      Mobility Bed Mobility Overal bed mobility: Needs Assistance Bed Mobility: Supine to Sit;Sit to Supine     Supine to sit: +2 for safety/equipment;Mod assist Sit to supine: Min assist;+2 for safety/equipment   General bed mobility comments: mod assist to ascend trunk, returned to supine with min assist for safety    Transfers Overall transfer level: Needs assistance Equipment used: Rolling walker (2 wheeled) Transfers: Sit to/from Stand Sit to Stand: Min assist;+2 safety/equipment         General transfer comment: min assist to power up and steady from EOB, cueing for hand placement but pt pulling on RW with BUES    Balance Overall balance assessment: Needs assistance Sitting-balance support: No upper extremity supported;Feet supported Sitting balance-Leahy Scale: Fair     Standing balance support: Bilateral upper extremity supported;No upper extremity supported;During functional activity Standing balance-Leahy Scale: Poor Standing balance comment: relies on UE and external support, able to wash hands with 0 hand support and min assist at sink                           ADL either performed or assessed with clinical judgement   ADL Overall ADL's : Needs assistance/impaired     Grooming: Minimal assistance;Standing;Wash/dry hands   Upper Body Bathing: Minimal assistance;Sitting   Lower Body Bathing: Moderate assistance;+2 for safety/equipment;Sit to/from stand   Upper Body Dressing : Sitting;Minimal assistance   Lower Body Dressing: +2 for safety/equipment;Sit to/from stand;Moderate assistance   Toilet Transfer: Minimal assistance;+2 for safety/equipment;Ambulation;RW Statistician Details (  indicate cue type and reason): simulated in room         Functional mobility during ADLs: Minimal assistance;+2 for safety/equipment;Rolling walker;Cueing for sequencing;Cueing for safety General ADL Comments: pt limited by cognition,  safety, balance     Vision   Additional Comments: requires cueing to locate items at sink, further assessment required     Perception     Praxis      Pertinent Vitals/Pain Pain Assessment: No/denies pain     Hand Dominance     Extremity/Trunk Assessment Upper Extremity Assessment Upper Extremity Assessment: Overall WFL for tasks assessed   Lower Extremity Assessment Lower Extremity Assessment: Defer to PT evaluation       Communication Communication Communication: No difficulties   Cognition Arousal/Alertness: Awake/alert Behavior During Therapy: Restless;Impulsive Overall Cognitive Status: No family/caregiver present to determine baseline cognitive functioning Area of Impairment: Orientation;Memory;Attention;Following commands;Safety/judgement;Awareness;Problem solving                 Orientation Level: Disoriented to;Situation;Place Current Attention Level: Focused Memory: Decreased short-term memory;Decreased recall of precautions Following Commands: Follows one step commands with increased time;Follows one step commands inconsistently Safety/Judgement: Decreased awareness of safety;Decreased awareness of deficits Awareness: Intellectual Problem Solving: Slow processing;Difficulty sequencing;Decreased initiation;Requires verbal cues;Requires tactile cues General Comments: pt oriented to self and time, follows simple commands inconsistently and perseverates on drinking water throughout session. Requires cueing to sequence basic self care and very impulsive requiring max redirection with poor safety awareness.   General Comments  VSS    Exercises     Shoulder Instructions      Home Living Family/patient expects to be discharged to:: Private residence Living Arrangements: Alone Available Help at Discharge: Family;Personal care attendant Type of Home: House                           Additional Comments: difficult to determine, pt poor  historian and confused      Prior Functioning/Environment Level of Independence: Independent with assistive device(s)        Comments: reports independent with ADLs, mobility using RW. Per chart [place] driving and completing meds independently.        OT Problem List: Decreased activity tolerance;Impaired balance (sitting and/or standing);Decreased cognition;Decreased safety awareness;Decreased knowledge of use of DME or AE;Decreased knowledge of precautions      OT Treatment/Interventions: Self-care/ADL training;DME and/or AE instruction;Therapeutic activities;Cognitive remediation/compensation;Patient/family education;Balance training    OT Goals(Current goals can be found in the care plan section) Acute Rehab OT Goals Patient Stated Goal: to get home OT Goal Formulation: With patient Time For Goal Achievement: 02/16/21 Potential to Achieve Goals: Good  OT Frequency: Min 2X/week   Barriers to D/C:            Co-evaluation PT/OT/SLP Co-Evaluation/Treatment: Yes Reason for Co-Treatment: For patient/therapist safety;To address functional/ADL transfers;Necessary to address cognition/behavior during functional activity   OT goals addressed during session: ADL's and self-care      AM-PAC OT "6 Clicks" Daily Activity     Outcome Measure Help from another person eating meals?: A Little Help from another person taking care of personal grooming?: A Little Help from another person toileting, which includes using toliet, bedpan, or urinal?: A Lot Help from another person bathing (including washing, rinsing, drying)?: A Little Help from another person to put on and taking off regular upper body clothing?: A Little Help from another person to put on and taking off regular lower body clothing?: A Lot 6 Click  Score: 16   End of Session Equipment Utilized During Treatment: Gait belt;Rolling walker Nurse Communication: Mobility status;Precautions  Activity Tolerance: Patient  tolerated treatment well Patient left: in bed;with call bell/phone within reach;with bed alarm set  OT Visit Diagnosis: Other abnormalities of gait and mobility (R26.89);Muscle weakness (generalized) (M62.81);History of falling (Z91.81);Other symptoms and signs involving cognitive function                Time: 7867-5449 OT Time Calculation (min): 16 min Charges:  OT General Charges $OT Visit: 1 Visit OT Evaluation $OT Eval Moderate Complexity: 1 Mod  Barry Brunner, OT Acute Rehabilitation Services Pager [phone] Office [phone]   Chancy Milroy 02/02/2021, 12:55 PM

## 2021-02-02 NOTE — Evaluation (Signed)
Physical Therapy Evaluation Patient Details Name: Noah Gentles Scalera Jr. MRN: [id number] DOB: [birth year] Today's Date: 02/02/2021   History of Present Illness  80 y.o. male with presents to [facility] on 01/31/2021 as a transfer from [facility] with CT head demonstrating SDH with midline shift. Pt was in DKA in [place] and also experienced seizure in ED. EEG with no seizure activity noted.  Neurosurgery recommending Keppra. PMH of poorly controlled DM2, HTN, afib with anticoagulation on hold since last month, OA, recurrent falls  Clinical Impression  Pt admitted secondary to problem above with deficits below. Pt very impulsive and with decreased safety awareness. Required max safety cues during session and min A +2 for steadying during mobility tasks. Per notes, pt does have caregiver support at home since leaving hospital. Recommend HHPT if pt has 24/7 support at home. If pt does not have 24/7 support, will need SNF level therapies. Will continue to follow acutely.     Follow Up Recommendations Home health PT;Supervision/Assistance - 24 hour (If does not have 24/7, will need SNF)    Equipment Recommendations  None recommended by PT    Recommendations for Other Services       Precautions / Restrictions Precautions Precautions: Fall Precaution Comments: seizures Restrictions Weight Bearing Restrictions: No      Mobility  Bed Mobility Overal bed mobility: Needs Assistance Bed Mobility: Supine to Sit;Sit to Supine     Supine to sit: +2 for safety/equipment;Mod assist Sit to supine: Min assist;+2 for safety/equipment   General bed mobility comments: mod assist to ascend trunk, returned to supine with min assist for safety    Transfers Overall transfer level: Needs assistance Equipment used: Rolling walker (2 wheeled) Transfers: Sit to/from Stand Sit to Stand: Min assist;+2 safety/equipment         General transfer comment: min assist to power up and steady from EOB, cueing for  hand placement but pt pulling on RW with BUES  Ambulation/Gait Ambulation/Gait assistance: Min assist;+2 physical assistance;+2 safety/equipment Gait Distance (Feet): 20 Feet Assistive device: Rolling walker (2 wheeled) Gait Pattern/deviations: Step-through pattern;Decreased stride length Gait velocity: Decreased   General Gait Details: Pt very impulsive and with decreased safety awareness when using RW. Pt requiring max safety cues throughout.  Stairs            Wheelchair Mobility    Modified Rankin (Stroke Patients Only)       Balance Overall balance assessment: Needs assistance Sitting-balance support: No upper extremity supported;Feet supported Sitting balance-Leahy Scale: Fair     Standing balance support: Bilateral upper extremity supported;No upper extremity supported;During functional activity Standing balance-Leahy Scale: Poor Standing balance comment: relies on UE and external support, able to wash hands with 0 hand support and min assist at sink                             Pertinent Vitals/Pain Pain Assessment: No/denies pain    Home Living Family/patient expects to be discharged to:: Private residence Living Arrangements: Alone Available Help at Discharge: Family;Personal care attendant Type of Home: House         Home Equipment: Dan Humphreys - 2 wheels;Cane - single point;Wheelchair - manual;Wheelchair - power Additional Comments: difficult to determine, pt poor historian and confused    Prior Function Level of Independence: Independent with assistive device(s)         Comments: reports independent with ADLs, mobility using RW. Per chart [place] driving and completing meds  independently. Unsure of accuracy as pt was recently d/c'd from SNF     Hand Dominance        Extremity/Trunk Assessment   Upper Extremity Assessment Upper Extremity Assessment: Defer to OT evaluation    Lower Extremity Assessment Lower Extremity Assessment:  Generalized weakness    Cervical / Trunk Assessment Cervical / Trunk Assessment: Normal  Communication   Communication: No difficulties  Cognition Arousal/Alertness: Awake/alert Behavior During Therapy: Restless;Impulsive Overall Cognitive Status: No family/caregiver present to determine baseline cognitive functioning Area of Impairment: Orientation;Memory;Attention;Following commands;Safety/judgement;Awareness;Problem solving                 Orientation Level: Disoriented to;Situation;Place Current Attention Level: Focused Memory: Decreased short-term memory;Decreased recall of precautions Following Commands: Follows one step commands with increased time;Follows one step commands inconsistently Safety/Judgement: Decreased awareness of safety;Decreased awareness of deficits Awareness: Intellectual Problem Solving: Slow processing;Difficulty sequencing;Decreased initiation;Requires verbal cues;Requires tactile cues General Comments: pt oriented to self and time, follows simple commands inconsistently and perseverates on drinking water throughout session. Requires cueing to sequence basic self care and very impulsive requiring max redirection with poor safety awareness.      General Comments General comments (skin integrity, edema, etc.): VSS    Exercises     Assessment/Plan    PT Assessment Patient needs continued PT services  PT Problem List Decreased strength;Decreased balance;Decreased mobility;Decreased cognition;Decreased knowledge of use of DME;Decreased safety awareness;Decreased knowledge of precautions       PT Treatment Interventions DME instruction;Gait training;Stair training;Functional mobility training;Therapeutic exercise;Balance training;Therapeutic activities;Patient/family education    PT Goals (Current goals can be found in the Care Plan section)  Acute Rehab PT Goals Patient Stated Goal: to get home PT Goal Formulation: With patient Time For Goal  Achievement: 02/16/21 Potential to Achieve Goals: Good    Frequency Min 3X/week   Barriers to discharge        Co-evaluation PT/OT/SLP Co-Evaluation/Treatment: Yes Reason for Co-Treatment: For patient/therapist safety;To address functional/ADL transfers;Necessary to address cognition/behavior during functional activity PT goals addressed during session: Mobility/safety with mobility;Balance;Proper use of DME OT goals addressed during session: ADL's and self-care       AM-PAC PT "6 Clicks" Mobility  Outcome Measure Help needed turning from your back to your side while in a flat bed without using bedrails?: A Little Help needed moving from lying on your back to sitting on the side of a flat bed without using bedrails?: A Lot Help needed moving to and from a bed to a chair (including a wheelchair)?: A Little Help needed standing up from a chair using your arms (e.g., wheelchair or bedside chair)?: A Little Help needed to walk in hospital room?: A Little Help needed climbing 3-5 steps with a railing? : A Lot 6 Click Score: 16    End of Session Equipment Utilized During Treatment: Gait belt Activity Tolerance: Patient tolerated treatment well Patient left: in bed;with call bell/phone within reach;with bed alarm set Nurse Communication: Mobility status PT Visit Diagnosis: Unsteadiness on feet (R26.81);Muscle weakness (generalized) (M62.81)    Time: 9518-8416 PT Time Calculation (min) (ACUTE ONLY): 21 min   Charges:   PT Evaluation $PT Eval Moderate Complexity: 1 Mod          Farley Ly, PT, DPT  Acute Rehabilitation Services  Pager: [phone] Office: [phone]   Lehman Prom 02/02/2021, 2:32 PM

## 2021-02-02 NOTE — Progress Notes (Signed)
PROGRESS NOTE    Noah Hora Melgarejo Jr.  YKD:[id number] DOB: 07/20/1941 DOA: 01/31/2021 PCP: Alinda Deem, MD    No chief complaint on file.   Brief Narrative: [age] year old gentleman prior history of poorly controlled type 2 diabetes, hyperlipidemia, hypertension and atrial fibrillation, anticoagulation on hold since last month and osteoarthritis with recurrent falls was seen at [facility] for a subdural hematoma.  He was later discharged to a skilled rehab facility.  He was recently discharged home and had another fall and was found to have subdural hematoma and in the ED at [facility]. he  Also had a tonic-clonic seizure in ED. Marland Kitchen Patient was transferred to [facility] for further evaluation and neurosurgery consulted. Assessment & Plan:   Active Problems:   Uncontrolled type 2 diabetes mellitus with complication, with long-term current use of insulin (HCC)   Essential hypertension, benign   Mixed hyperlipidemia   Subdural hematoma (HCC)   Subdural hematoma Patient has a chronic right-sided subdural hematoma remains unchanged in the follow-up imaging this morning. EEG done, no seizure activity on identified on EEG. Neurosurgery consulted and recommended no neurosurgical intervention at this time.  Continue with Keppra.  Hold anticoagulation.  Therapy evaluations recommending home health OT with 24 hour supervision.  Pt confused this am .    Acute encephalopathy in the setting of chronic subdural hematoma and dementia with possible hospital acquired delirium.  Pt confused, trying to get out of bed.      Diabetes mellitus CBG (last 3)  Recent Labs    02/01/21 2143 02/02/21 0644 02/02/21 1213  GLUCAP 307* 363* 457*  Uncontrolled,insulin  dependent  Hemoglobin A1c is 8.8/  Increase lantus to 10 units BID. Change SSI to resistant scale and add 2 units TIDAC.  Check BMP today.   Hyperlipidemia Continue with  pravastatin.    Hypertension Better controlled today.  continue with  amlodipine, Cozaar, metoprolol and as needed hydralazine.    Paroxysmal atrial fibrillation.  Rate controlled. Not on anti coagulation due to sub dural hematoma.        DVT prophylaxis: SCDs Code Status: Full code Family Communication: None at bedside, called his sister and left a message on the phone. Disposition:   Status is: Observation  The patient will require care spanning > 2 midnights and should be moved to inpatient because: Unsafe d/c plan  Dispo: The patient is from: Home              Anticipated d/c is to: pending              Patient currently is not medically stable to d/c.   Difficult to place patient No       Consultants:   Neuro surgery.   Procedures: EEG  Antimicrobials:  NONE.   Subjective: Pt remains confused, trying to get out of bed. Denies any chest pain or sob.   Objective: Vitals:   02/01/21 2157 02/02/21 0418 02/02/21 0921 02/02/21 1201  BP: 114/89 124/75 (!) 155/93 (!) 159/75  Pulse: (!) 110 (!) 101 (!) 105 77  Resp: 18 13 13 15   Temp: 98.4 F (36.9 C) 97.7 F (36.5 C)  (!) 97.4 F (36.3 C)  TempSrc: Oral Oral  Oral  SpO2: 99% 94% 97% 94%    Intake/Output Summary (Last 24 hours) at 02/02/2021 1426 Last data filed at 02/02/2021 0400 Gross per 24 hour  Intake 260 ml  Output 850 ml  Net -590 ml   There were  no vitals filed for this visit.  Examination:  General exam: Alert and comfortable, not in distress Respiratory system: Air entry fair, no wheezing or rhonchi Cardiovascular system: S1 & S2 heard, irregularly irregular,   No pedal edema. Gastrointestinal system: Abdomen is soft nontender nondistended bowel sounds normal Central nervous system: Alert, able to move all extremities, oriented to person only, confused. Extremities: No pedal edema Skin: No rashes seen  psychiatry: Patient confused.    Data Reviewed: I have personally reviewed  following labs and imaging studies  CBC: Recent Labs  Lab 01/31/21 2207  WBC 7.9  NEUTROABS 5.7  HGB 13.7  HCT 39.6  MCV 96.6  PLT 200    Basic Metabolic Panel: Recent Labs  Lab 01/31/21 2207  NA 135  K 4.3  CL 100  CO2 23  GLUCOSE 271*  BUN 15  CREATININE 1.12  CALCIUM 9.8    GFR: CrCl cannot be calculated (Unknown ideal weight.).  Liver Function Tests: Recent Labs  Lab 01/31/21 2207  AST 16  ALT 21  ALKPHOS 96  BILITOT 1.5*  PROT 5.4*  ALBUMIN 3.1*    CBG: Recent Labs  Lab 02/01/21 1238 02/01/21 1604 02/01/21 2143 02/02/21 0644 02/02/21 1213  GLUCAP 221* 113* 307* 363* 457*     Recent Results (from the past 240 hour(s))  SARS CORONAVIRUS 2 (TAT 6-24 HRS) Nasopharyngeal Nasopharyngeal Swab     Status: None   Collection Time: 02/01/21  2:01 AM   Specimen: Nasopharyngeal Swab  Result Value Ref Range Status   SARS Coronavirus 2 NEGATIVE NEGATIVE Final    Comment: (NOTE) SARS-CoV-2 target nucleic acids are NOT DETECTED.  The SARS-CoV-2 RNA is generally detectable in upper and lower respiratory specimens during the acute phase of infection. Negative results do not preclude SARS-CoV-2 infection, do not rule out co-infections with other pathogens, and should not be used as the sole basis for treatment or other patient management decisions. Negative results must be combined with clinical observations, patient history, and epidemiological information. The expected result is Negative.  Fact Sheet for Patients: HairSlick.no  Fact Sheet for Healthcare Providers: quierodirigir.com  This test is not yet approved or cleared by the [place] FDA and  has been authorized for detection and/or diagnosis of SARS-CoV-2 by FDA under an Emergency Use Authorization (EUA). This EUA will remain  in effect (meaning this test can be used) for the duration of the COVID-19 declaration under Se ction  564(b)(1) of the Act, 21 U.S.C. section 360bbb-3(b)(1), unless the authorization is terminated or revoked sooner.  Performed at [facility], [address] [place], [place] [address]          Radiology Studies: CT HEAD WO CONTRAST  Result Date: 02/01/2021 CLINICAL DATA:  Seizure EXAM: CT HEAD WITHOUT CONTRAST TECHNIQUE: Contiguous axial images were obtained from the base of the skull through the vertex without intravenous contrast. COMPARISON:  None. FINDINGS: Brain: There is a low-density subdural fluid collections seen overlying the right frontal and parietal lobes measuring up to 13 mm in thickness compatible with chronic subdural hematoma. 5 mm of right to left midline shift. No hydrocephalus. No hemorrhage or acute infarction. Old left occipital infarct. Vascular: No hyperdense vessel or unexpected calcification. Skull: No acute calvarial abnormality. Sinuses/Orbits: No acute findings Other: None IMPRESSION: Chronic right subdural hematoma measuring up to 13 mm overlying the right frontal lobe. 5 mm of right to left midline shift. Old left occipital infarct. Electronically Signed   By: Charlett Nose  M.D.   On: 02/01/2021 00:44   EEG adult  Result Date: 02/01/2021 Charlsie Quest, MD     02/01/2021  9:04 AM Patient Name: Noah Hora Pyeatt Montez Hageman. MRN: [id number] Epilepsy Attending: Charlsie Quest Referring Physician/Provider: Dr. Midge Minium Date: 01/31/2021 Duration: 22.04 mins Patient history: 80 year old male presented with syncope. While in the ER, patient had a generalized tonic-clonic seizure.  EEG to evaluate for seizures. Level of alertness: Awake AEDs during EEG study: LEV Technical aspects: This EEG study was done with scalp electrodes positioned according to the 10-20 International system of electrode placement. Electrical activity was acquired at a sampling rate of 500Hz  and reviewed with a high frequency filter of 70Hz  and a low frequency filter of 1Hz . EEG data were  recorded continuously and digitally stored. Description: The posterior dominant rhythm consists of 8-9 Hz activity of moderate voltage (25-35 uV) seen predominantly in posterior head regions, symmetric and reactive to eye opening and eye closing.  Hyperventilation and photic stimulation were not performed.   IMPRESSION: This study is within normal limits. No seizures or epileptiform discharges were seen throughout the recording. Priyanka        Scheduled Meds: . amantadine  100 mg Oral BID  . amLODipine  10 mg Oral Daily  . insulin aspart  0-20 Units Subcutaneous TID WC  . insulin aspart  2 Units Subcutaneous TID WC  . insulin glargine  10 Units Subcutaneous BID  . levETIRAcetam  1,000 mg Oral BID  . losartan  100 mg Oral Daily  . metoprolol tartrate  25 mg Oral BID  . pravastatin  20 mg Oral q1800   Continuous Infusions:    LOS: 1 day       , MD [facility]   To contact the attending provider between 7A-7P or the covering provider during after hours 7P-7A, please log into the web site [email or web address] and access using universal [place] password for that web site. If you do not have the password, please call the hospital operator.  02/02/2021, 2:26 PM

## 2021-02-03 LAB — BASIC METABOLIC PANEL
Anion gap: 9 (ref 5–15)
BUN: 11 mg/dL (ref 8–23)
CO2: 24 mmol/L (ref 22–32)
Calcium: 9.4 mg/dL (ref 8.9–10.3)
Chloride: 103 mmol/L (ref 98–111)
Creatinine, Ser: 0.89 mg/dL (ref 0.61–1.24)
GFR, Estimated: >60 mL/min (ref >60)
Glucose, Bld: 327 mg/dL — ABNORMAL HIGH (ref 70–99)
Potassium: 3.6 mmol/L (ref 3.5–5.1)
Sodium: 136 mmol/L (ref 135–145)

## 2021-02-03 LAB — GLUCOSE, CAPILLARY: Glucose-Capillary: 293 mg/dL — ABNORMAL HIGH (ref 70–99)

## 2021-02-03 LAB — SARS CORONAVIRUS 2 (TAT 6-24 HRS): SARS Coronavirus 2: NEGATIVE

## 2021-02-03 MED ORDER — INSULIN ASPART 100 UNIT/ML ~~LOC~~ SOLN: SUBCUTANEOUS | 11 refills | Status: AC

## 2021-02-03 MED ORDER — LOSARTAN POTASSIUM 50 MG PO TABS: 100.0000 mg | ORAL_TABLET | Freq: Every day | ORAL | 0 refills | Status: AC

## 2021-02-03 MED ORDER — INSULIN GLARGINE 100 UNIT/ML ~~LOC~~ SOLN: 10.0000 [IU] | Freq: Two times a day (BID) | SUBCUTANEOUS | 11 refills | Status: AC

## 2021-02-03 NOTE — Progress Notes (Signed)
Spoke with patient and his sister at the bedside. Reviewed insulin dosages, treating hypoglycemia, CHO counting, storage of insulin, sites for giving insulin, answering questions.   Sister states that he has Lantus and Novolog in vials at home. Has plenty of syringes. He will be having around the clock caregivers that can give insulin and check blood sugars. Sister will be giving insulin tonight after discharge. Staff RN to review drawing up insulin in syringe with sister. Patient can give insulin if drawn up in syringe and given to him. Patient has home blood glucose meter, strips, and lancets.  Given reading information on diet, insulin administration, treating hypoglycemia, CHO counting.   Smith Mince RN BSN CDE Diabetes Coordinator Pager: [phone]  8am-5pm

## 2021-02-03 NOTE — Discharge Summary (Signed)
Physician Discharge Summary  Noah Daniel. CWC:[id number] DOB: Jul 10, 1941 DOA: 01/31/2021  PCP: Earney Mallet, MD  Admit date: 01/31/2021 Discharge date: 02/03/2021  Admitted From: Home  Disposition:  HOme   Recommendations for Outpatient Follow-up:  1. Follow up with PCP in 1-2 weeks 2. Please obtain BMP/CBC in one week 3. Please follow up  With neuro surgery as recommended in 1 to 2 weeks.  4. Please hold the anti coagulation.   Home Health:YES.   Discharge Condition:stable  CODE STATUS: full code.  Diet recommendation: Heart Healthy   Brief/Interim Summary:  80 year old gentleman prior history of poorly controlled type 2 diabetes, hyperlipidemia, hypertension and atrial fibrillation, anticoagulation on hold since last month and osteoarthritis with recurrent falls was seen at [facility] for a subdural hematoma.  He was later discharged to a skilled rehab facility.  He was recently discharged home and had another fall and was found to have subdural hematoma and in the ED at [facility]. he  Also had a tonic-clonic seizure in ED. Marland Kitchen Patient was transferred to [facility] for further evaluation and neurosurgery consulted.   Discharge Diagnoses:  Active Problems:   Uncontrolled type 2 diabetes mellitus with complication, with long-term current use of insulin (HCC)   Essential hypertension, benign   Mixed hyperlipidemia   Subdural hematoma (HCC)   Subdural hematoma Patient has a chronic right-sided subdural hematoma remains unchanged in the follow-up imaging this morning. EEG done, no seizure activity on identified on EEG. Neurosurgery consulted and recommended no neurosurgical intervention at this time.  Continue with Keppra.  Hold anticoagulation.  Therapy evaluations recommending home health OT with 24 hour supervision.    Acute encephalopathy in the setting of chronic subdural hematoma and dementia with possible hospital acquired delirium.   Appears to have improved. Pt is alert and oriented, .      Diabetes mellitus insulin dependent, uncontrolled.  CBG (last 3)  Recent Labs    02/02/21 1645 02/02/21 2141 02/03/21 1154  GLUCAP 433* 208* 293*     Hemoglobin A1c is 8.8/  Increase lantus to 10 units BID. Change SSI to resistant scale and glucotrol on discharge.    Hyperlipidemia Continue with pravastatin.    Hypertension Better controlled today.  continue with  amlodipine, Cozaar, metoprolol .    Paroxysmal atrial fibrillation.  Rate controlled. Not on anti coagulation due to sub dural hematoma.       Discharge Instructions  Discharge Instructions    Diet - low sodium heart healthy   Complete by: As directed    Discharge instructions   Complete by: As directed    Follow up with PCP in one week.  Please follow up with neuro surgery in 1 week as recommended.     Allergies as of 02/03/2021      Reactions   Statins Other (See Comments)   joint pain      Medication List    STOP taking these medications   Eliquis 5 MG Tabs tablet Generic drug: apixaban     TAKE these medications   allopurinol 100 MG tablet Commonly known as: ZYLOPRIM Take 100 mg by mouth at bedtime.   Amantadine HCl 100 MG tablet Take 100 mg by mouth 2 (two) times daily.   amLODipine 10 MG tablet Commonly known as: NORVASC Take 10 mg by mouth daily.   BD Insulin Syringe U/F 31G X 5/16" 0.5 ML Misc Generic drug: Insulin Syringe-Needle U-100 USE 1 SYRINGE 4 TIMES DAILY What  changed: Another medication with the same name was removed. Continue taking this medication, and follow the directions you see here.   blood glucose meter kit and supplies Kit Dispense based on patient and insurance preference. Use up to four times daily as directed. (FOR ICD-10 E11.65)   doxazosin 1 MG tablet Commonly known as: CARDURA Take 1 mg by mouth daily.   famotidine 20 MG tablet Commonly known as: PEPCID Take 20 mg  by mouth 2 (two) times daily.   glipiZIDE 5 MG tablet Commonly known as: GLUCOTROL Take 5 mg by mouth 2 (two) times daily.   glucose blood test strip DX E11.65   True Metrix Blood Glucose Test test strip Generic drug: glucose blood USE TO CHECK BLOOD GLUCOSE UP TO 4 TIMES DAILY AS DIRECTED   insulin aspart 100 UNIT/ML injection Commonly known as: novoLOG CBG 70 - 120: 0 units CBG 121 - 150: 3 units CBG 151 - 200: 4 units CBG 201 - 250: 7 units CBG 251 - 300: 11 units CBG 301 - 350: 15 units CBG 351 - 400: 20 units What changed: additional instructions   insulin glargine 100 UNIT/ML injection Commonly known as: LANTUS Inject 0.1 mLs (10 Units total) into the skin 2 (two) times daily.   levETIRAcetam 1000 MG tablet Commonly known as: KEPPRA Take 1,000 mg by mouth 2 (two) times daily.   loratadine 10 MG tablet Commonly known as: CLARITIN Take 10 mg by mouth daily.   losartan 50 MG tablet Commonly known as: COZAAR Take 2 tablets (100 mg total) by mouth daily. Start taking on: February 04, 2021   metoprolol tartrate 25 MG tablet Commonly known as: LOPRESSOR Take 25 mg by mouth 2 (two) times daily.   multivitamin with minerals Tabs tablet Take 1 tablet by mouth daily.   pravastatin 20 MG tablet Commonly known as: PRAVACHOL Take 20 mg by mouth. Mon, Wed, Fri   True Metrix Meter w/Device Kit USE TO TEST BLOOD SUGAR UP TO 4 TIMES A DAY AS DIRECTED   Vitamin D (Cholecalciferol) 25 MCG (1000 UT) Caps Take 1 tablet by mouth daily.       Follow-up Information    [facility] Follow up.   Why: The home health agency will contact you for the first home visit. Contact information: [place] [address] [phone]        Glenda Chroman, MD Follow up on 02/09/2021.   Specialty: Internal Medicine Why: Your appointment is at 2;45 pm.  Contact information: [place] [address] [phone]              Allergies   Allergen Reactions  . Statins Other (See Comments)    joint pain    Consultations:     Procedures/Studies: CT HEAD WO CONTRAST  Result Date: 02/01/2021 CLINICAL DATA:  Seizure EXAM: CT HEAD WITHOUT CONTRAST TECHNIQUE: Contiguous axial images were obtained from the base of the skull through the vertex without intravenous contrast. COMPARISON:  None. FINDINGS: Brain: There is a low-density subdural fluid collections seen overlying the right frontal and parietal lobes measuring up to 13 mm in thickness compatible with chronic subdural hematoma. 5 mm of right to left midline shift. No hydrocephalus. No hemorrhage or acute infarction. Old left occipital infarct. Vascular: No hyperdense vessel or unexpected calcification. Skull: No acute calvarial abnormality. Sinuses/Orbits: No acute findings Other: None IMPRESSION: Chronic right subdural hematoma measuring up to 13 mm overlying the right frontal lobe. 5  mm of right to left midline shift. Old left occipital infarct. Electronically Signed   By: Rolm Baptise M.D.   On: 02/01/2021 00:44   EEG adult  Result Date: 02/01/2021 Lora Havens, MD     02/01/2021  9:04 AM Patient Name: Noah Lips Votaw Jr. MRN: [id number] Epilepsy Attending: Lora Havens Referring Physician/Provider: Dr. Gean Birchwood Date: 01/31/2021 Duration: 22.04 mins Patient history: 80 year old male presented with syncope. While in the ER, patient had a generalized tonic-clonic seizure.  EEG to evaluate for seizures. Level of alertness: Awake AEDs during EEG study: LEV Technical aspects: This EEG study was done with scalp electrodes positioned according to the 10-20 International system of electrode placement. Electrical activity was acquired at a sampling rate of '500Hz'  and reviewed with a high frequency filter of '70Hz'  and a low frequency filter of '1Hz' . EEG data were recorded continuously and digitally stored. Description: The posterior dominant rhythm consists of 8-9 Hz activity  of moderate voltage (25-35 uV) seen predominantly in posterior head regions, symmetric and reactive to eye opening and eye closing.  Hyperventilation and photic stimulation were not performed.   IMPRESSION: This study is within normal limits. No seizures or epileptiform discharges were seen throughout the recording. Priyanka Barbra Sarks       Subjective: No new complaints.   Discharge Exam: Vitals:   02/03/21 0905 02/03/21 1149  BP: (!) 154/85 126/87  Pulse: 82 85  Resp: 15 17  Temp: (!) 97.5 F (36.4 C) (!) 97.4 F (36.3 C)  SpO2: 94% 97%   Vitals:   02/02/21 2237 02/02/21 2337 02/03/21 0905 02/03/21 1149  BP: (!) 156/82 (!) 151/68 (!) 154/85 126/87  Pulse: 80 66 82 85  Resp:  '16 15 17  ' Temp:  98.5 F (36.9 C) (!) 97.5 F (36.4 C) (!) 97.4 F (36.3 C)  TempSrc:  Oral Oral Oral  SpO2:  97% 94% 97%    General: Pt is alert, awake, not in acute distress Cardiovascular: RRR, S1/S2 +, no rubs, no gallops Respiratory: CTA bilaterally, no wheezing, no rhonchi Abdominal: Soft, NT, ND, bowel sounds + Extremities: no edema, no cyanosis    The results of significant diagnostics from this hospitalization (including imaging, microbiology, ancillary and laboratory) are listed below for reference.     Microbiology: Recent Results (from the past 240 hour(s))  SARS CORONAVIRUS 2 (TAT 6-24 HRS) Nasopharyngeal Nasopharyngeal Swab     Status: None   Collection Time: 02/01/21  2:01 AM   Specimen: Nasopharyngeal Swab  Result Value Ref Range Status   SARS Coronavirus 2 NEGATIVE NEGATIVE Final    Comment: (NOTE) SARS-CoV-2 target nucleic acids are NOT DETECTED.  The SARS-CoV-2 RNA is generally detectable in upper and lower respiratory specimens during the acute phase of infection. Negative results do not preclude SARS-CoV-2 infection, do not rule out co-infections with other pathogens, and should not be used as the sole basis for treatment or other patient management  decisions. Negative results must be combined with clinical observations, patient history, and epidemiological information. The expected result is Negative.  Fact Sheet for Patients: SugarRoll.be  Fact Sheet for Healthcare Providers: https://www.woods-mathews.com/  This test is not yet approved or cleared by the [place] FDA and  has been authorized for detection and/or diagnosis of SARS-CoV-2 by FDA under an Emergency Use Authorization (EUA). This EUA will remain  in effect (meaning this test can be used) for the duration of the COVID-19 declaration under Se ction 564(b)(1) of the Act, 21 U.S.C.  section 360bbb-3(b)(1), unless the authorization is terminated or revoked sooner.  Performed at [facility], [place] [address], [place], [place] [address]   SARS CORONAVIRUS 2 (TAT 6-24 HRS)     Status: None   Collection Time: 02/03/21  1:18 PM  Result Value Ref Range Status   SARS Coronavirus 2 NEGATIVE NEGATIVE Final    Comment: (NOTE) SARS-CoV-2 target nucleic acids are NOT DETECTED.  The SARS-CoV-2 RNA is generally detectable in upper and lower respiratory specimens during the acute phase of infection. Negative results do not preclude SARS-CoV-2 infection, do not rule out co-infections with other pathogens, and should not be used as the sole basis for treatment or other patient management decisions. Negative results must be combined with clinical observations, patient history, and epidemiological information. The expected result is Negative.  Fact Sheet for Patients: SugarRoll.be  Fact Sheet for Healthcare Providers: https://www.woods-mathews.com/  This test is not yet approved or cleared by the [place] FDA and  has been authorized for detection and/or diagnosis of SARS-CoV-2 by FDA under an Emergency Use Authorization (EUA). This EUA will remain  in effect (meaning this test can  be used) for the duration of the COVID-19 declaration under Se ction 564(b)(1) of the Act, 21 U.S.C. section 360bbb-3(b)(1), unless the authorization is terminated or revoked sooner.  Performed at [facility], [place] [address] [place], [place] [address]      Labs: BNP (last 3 results) No results for input(s): BNP in the last 8760 hours. Basic Metabolic Panel: Recent Labs  Lab 01/31/21 2207 02/02/21 1347 02/03/21 0919  NA 135 133* 136  K 4.3 4.6 3.6  CL 100 98 103  CO2 23 20* 24  GLUCOSE 271* 498* 327*  BUN '15 16 11  ' CREATININE 1.12 1.31* 0.89  CALCIUM 9.8 9.7 9.4   Liver Function Tests: Recent Labs  Lab 01/31/21 2207  AST 16  ALT 21  ALKPHOS 96  BILITOT 1.5*  PROT 5.4*  ALBUMIN 3.1*   No results for input(s): LIPASE, AMYLASE in the last 168 hours. No results for input(s): AMMONIA in the last 168 hours. CBC: Recent Labs  Lab 01/31/21 2207  WBC 7.9  NEUTROABS 5.7  HGB 13.7  HCT 39.6  MCV 96.6  PLT 200   Cardiac Enzymes: No results for input(s): CKTOTAL, CKMB, CKMBINDEX, TROPONINI in the last 168 hours. BNP: Invalid input(s): POCBNP CBG: Recent Labs  Lab 02/02/21 0644 02/02/21 1213 02/02/21 1645 02/02/21 2141 02/03/21 1154  GLUCAP 363* 457* 433* 208* 293*   D-Dimer No results for input(s): DDIMER in the last 72 hours. Hgb A1c Recent Labs    01/31/21 2208  HGBA1C 8.8*   Lipid Profile No results for input(s): CHOL, HDL, LDLCALC, TRIG, CHOLHDL, LDLDIRECT in the last 72 hours. Thyroid function studies Recent Labs    02/02/21 1533  TSH 4.058   Anemia work up Recent Labs    02/02/21 1533  VITAMINB12 370   Urinalysis No results found for: COLORURINE, APPEARANCEUR, LABSPEC, [place], GLUCOSEU, HGBUR, BILIRUBINUR, KETONESUR, PROTEINUR, UROBILINOGEN, NITRITE, LEUKOCYTESUR Sepsis Labs Invalid input(s): PROCALCITONIN,  WBC,  LACTICIDVEN Microbiology Recent Results (from the past 240 hour(s))  SARS CORONAVIRUS 2 (TAT 6-24 HRS)  Nasopharyngeal Nasopharyngeal Swab     Status: None   Collection Time: 02/01/21  2:01 AM   Specimen: Nasopharyngeal Swab  Result Value Ref Range Status   SARS Coronavirus 2 NEGATIVE NEGATIVE Final    Comment: (NOTE) SARS-CoV-2 target nucleic acids are NOT DETECTED.  The SARS-CoV-2  RNA is generally detectable in upper and lower respiratory specimens during the acute phase of infection. Negative results do not preclude SARS-CoV-2 infection, do not rule out co-infections with other pathogens, and should not be used as the sole basis for treatment or other patient management decisions. Negative results must be combined with clinical observations, patient history, and epidemiological information. The expected result is Negative.  Fact Sheet for Patients: SugarRoll.be  Fact Sheet for Healthcare Providers: https://www.woods-mathews.com/  This test is not yet approved or cleared by the [place] FDA and  has been authorized for detection and/or diagnosis of SARS-CoV-2 by FDA under an Emergency Use Authorization (EUA). This EUA will remain  in effect (meaning this test can be used) for the duration of the COVID-19 declaration under Se ction 564(b)(1) of the Act, 21 U.S.C. section 360bbb-3(b)(1), unless the authorization is terminated or revoked sooner.  Performed at [facility], [place] [address], [place], [place] [address]   SARS CORONAVIRUS 2 (TAT 6-24 HRS)     Status: None   Collection Time: 02/03/21  1:18 PM  Result Value Ref Range Status   SARS Coronavirus 2 NEGATIVE NEGATIVE Final    Comment: (NOTE) SARS-CoV-2 target nucleic acids are NOT DETECTED.  The SARS-CoV-2 RNA is generally detectable in upper and lower respiratory specimens during the acute phase of infection. Negative results do not preclude SARS-CoV-2 infection, do not rule out co-infections with other pathogens, and should not be used as the sole basis for treatment  or other patient management decisions. Negative results must be combined with clinical observations, patient history, and epidemiological information. The expected result is Negative.  Fact Sheet for Patients: SugarRoll.be  Fact Sheet for Healthcare Providers: https://www.woods-mathews.com/  This test is not yet approved or cleared by the [place] FDA and  has been authorized for detection and/or diagnosis of SARS-CoV-2 by FDA under an Emergency Use Authorization (EUA). This EUA will remain  in effect (meaning this test can be used) for the duration of the COVID-19 declaration under Se ction 564(b)(1) of the Act, 21 U.S.C. section 360bbb-3(b)(1), unless the authorization is terminated or revoked sooner.  Performed at [facility], [place] [address] [place], [place] [address]      Time coordinating discharge: 32 minutes.   SIGNED:   Hosie Poisson, MD  [facility] 02/03/2021, 6:20 PM

## 2021-02-03 NOTE — Progress Notes (Signed)
Occupational Therapy Treatment Patient Details Name: Noah Daniel Hageman. MRN: [id number] DOB: [birth year] Today's Date: 02/03/2021    History of present illness 80 y.o. male with presents to [facility] on 01/31/2021 as a transfer from [facility] with CT head demonstrating SDH with midline shift. Pt was in DKA in [place] and also experienced seizure in ED. EEG with no seizure activity noted.  Neurosurgery recommending Keppra. PMH of poorly controlled DM2, HTN, afib with anticoagulation on hold since last month, OA, recurrent falls   OT comments  Pt progressing towards established OT goals. Pt demonstrating increased activity tolerance, attention, and following of commands. Pt agreeable to mobility in hallway and required Min Guard A and RW. Providing education on safe RW management. Sister with questions about shower seated; provided education on use of tub bench (which he has) and curtain placement. Also providing pt with gait belt and education on use. Continue to recommend dc to home with HHOT and will continue to follow acutely as admitted.    Follow Up Recommendations  Home health OT;Supervision/Assistance - 24 hour    Equipment Recommendations  None recommended by OT    Recommendations for Other Services Speech consult    Precautions / Restrictions Precautions Precautions: Fall Precaution Comments: seizures       Mobility Bed Mobility               General bed mobility comments: Pt sitting at EOB upon arrival    Transfers Overall transfer level: Needs assistance Equipment used: Rolling walker (2 wheeled) Transfers: Sit to/from Stand Sit to Stand: Min guard;Min assist         General transfer comment: Min A for power up. Min guard A for safety with sitting.    Balance Overall balance assessment: Needs assistance Sitting-balance support: No upper extremity supported;Feet supported Sitting balance-Leahy Scale: Fair     Standing balance support: Bilateral upper  extremity supported;No upper extremity supported;During functional activity Standing balance-Leahy Scale: Poor Standing balance comment: relies on UE and external support, able to wash hands with 0 hand support and min assist at sink                           ADL either performed or assessed with clinical judgement   ADL Overall ADL's : Needs assistance/impaired                         Toilet Transfer: Min guard;Ambulation;RW (simulated in room) Toilet Transfer Details (indicate cue type and reason): Educating pt's sister on use of gait belt       Tub/Shower Transfer Details (indicate cue type and reason): Educating sister on use of tub bench Functional mobility during ADLs: Min guard;Rolling walker General ADL Comments: Pt continues to present with decreased balance and cognition. Providing education on use of tub bench as well as RW management during functional trnasfers     Vision       Perception     Praxis      Cognition Arousal/Alertness: Awake/alert Behavior During Therapy: Restless;Impulsive Overall Cognitive Status: No family/caregiver present to determine baseline cognitive functioning Area of Impairment: Memory;Attention;Following commands;Safety/judgement;Awareness;Problem solving                   Current Attention Level: Sustained Memory: Decreased short-term memory;Decreased recall of precautions Following Commands: Follows one step commands with increased time;Follows one step commands inconsistently Safety/Judgement: Decreased awareness of safety;Decreased awareness of deficits  Awareness: Intellectual Problem Solving: Slow processing;Difficulty sequencing;Decreased initiation;Requires verbal cues;Requires tactile cues General Comments: Pt following simple commands. Able to correct RW management with education and cues. Benefits from calm environment        Exercises     Shoulder Instructions       General Comments Sister  present throughout    Pertinent Vitals/ Pain       Pain Assessment: No/denies pain  Home Living                                          Prior Functioning/Environment              Frequency  Min 2X/week        Progress Toward Goals  OT Goals(current goals can now be found in the care plan section)  Progress towards OT goals: Progressing toward goals  Acute Rehab OT Goals Patient Stated Goal: to get home OT Goal Formulation: With patient Time For Goal Achievement: 02/16/21 Potential to Achieve Goals: Good ADL Goals Pt Will Perform Grooming: with supervision;standing Pt Will Perform Lower Body Dressing: with supervision;sit to/from stand Pt Will Transfer to Toilet: with supervision;ambulating Pt Will Perform Toileting - Clothing Manipulation and hygiene: with supervision;sit to/from stand Additional ADL Goal #1: Pt will attend to and complete 2 step ADL task with supervision.  Plan Discharge plan remains appropriate    Co-evaluation    PT/OT/SLP Co-Evaluation/Treatment: Yes Reason for Co-Treatment: For patient/therapist safety;To address functional/ADL transfers   OT goals addressed during session: ADL's and self-care      AM-PAC OT "6 Clicks" Daily Activity     Outcome Measure   Help from another person eating meals?: A Little Help from another person taking care of personal grooming?: A Little Help from another person toileting, which includes using toliet, bedpan, or urinal?: A Lot Help from another person bathing (including washing, rinsing, drying)?: A Little Help from another person to put on and taking off regular upper body clothing?: A Little Help from another person to put on and taking off regular lower body clothing?: A Lot 6 Click Score: 16    End of Session Equipment Utilized During Treatment: Gait belt;Rolling walker  OT Visit Diagnosis: Other abnormalities of gait and mobility (R26.89);Muscle weakness (generalized)  (M62.81);History of falling (Z91.81);Other symptoms and signs involving cognitive function   Activity Tolerance Patient tolerated treatment well   Patient Left with call bell/phone within reach;in chair;with family/visitor present   Nurse Communication Mobility status;Other (comment) (ready for dc)        Time: 4098-1191 OT Time Calculation (min): 22 min  Charges: OT General Charges $OT Visit: 1 Visit OT Treatments $Self Care/Home Management : 8-22 mins  Sanna Porcaro MSOT, OTR/L Acute Rehab Pager: [phone] Office: [phone]   Theodoro Grist Anson Peddie 02/03/2021, 4:10 PM

## 2021-02-03 NOTE — Care Management Important Message (Signed)
Important Message  Patient Details  Name: Noah Daniel. MRN: [id number] Date of Birth: 1941-05-11   Medicare Important Message Given:  Yes     Lachrista Heslin 02/03/2021, 2:11 PM

## 2021-02-03 NOTE — Plan of Care (Signed)

## 2021-02-03 NOTE — TOC Transition Note (Signed)
Transition of Care [facility]) - CM/SW Discharge Note   Patient Details  Name: Noah Chiarelli Pelto Jr. MRN: [id number] Date of Birth: 1941-07-03  Transition of Care [facility]) CM/SW Contact:  Kermit Balo, RN Phone Number: 02/03/2021, 3:30 PM   Clinical Narrative:    Patient is discharging home with [facility] services through: [facility]. Pts sister has arranged 24 hour caregivers that will start in the am. Sister and BIL to stay with patient tonight.  Pt has transport home.   Final next level of care: Home w Home Health Services Barriers to Discharge: No Barriers Identified   Patient Goals and CMS Choice   CMS Medicare.gov Compare Post Acute Care list provided to:: Patient Choice offered to / list presented to : Patient,Sibling  Discharge Placement                       Discharge Plan and Services   Discharge Planning Services: CM Consult                      HH Arranged: PT,OT,RN,Social Work [facility] Agency:  ([facility]) Date HH Agency Contacted: 02/03/21   Representative spoke with at [facility] Agency: Lafonda Mosses  Social Determinants of Health (SDOH) Interventions     Readmission Risk Interventions No flowsheet data found.

## 2021-03-08 ENCOUNTER — Other Ambulatory Visit: Payer: Self-pay | Admitting: "Endocrinology

## 2021-07-15 DEATH — deceased

## 2022-08-19 IMAGING — CT CT HEAD W/O CM
4 series · 17 of 47 positions shown, 19 images · non-contrast
Comparison: None.

CLINICAL DATA: Seizure

EXAM:
CT HEAD WITHOUT CONTRAST
TECHNIQUE: Contiguous axial images were obtained from the base of the skull
through the vertex without intravenous contrast.

[Series 3: head wo · axial · 0.41mm/px · z∈[+1170,+1290]mm · 6 of 34 slices shown, 8 images]
[im 5/34  brain]
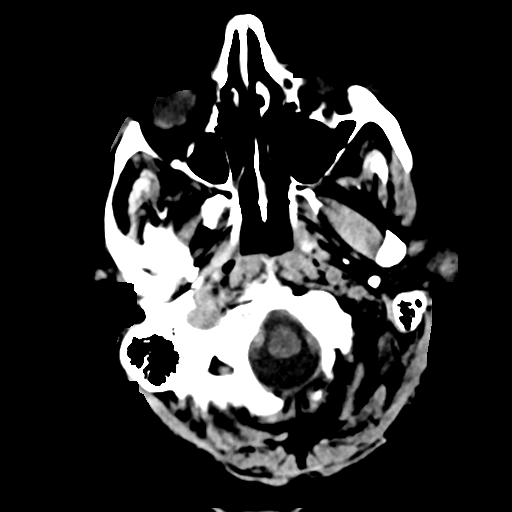
[im 5/34  bone]
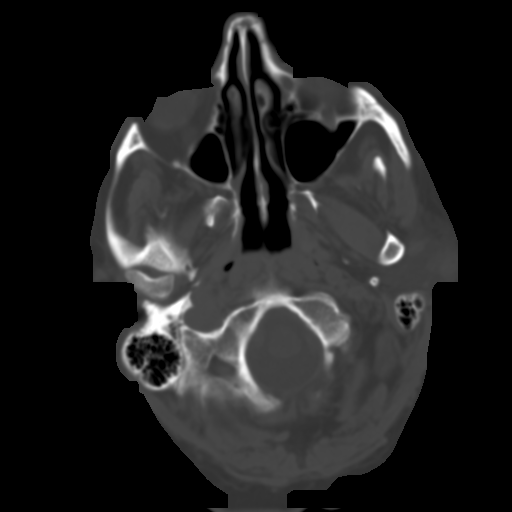
[im 10/34  brain]
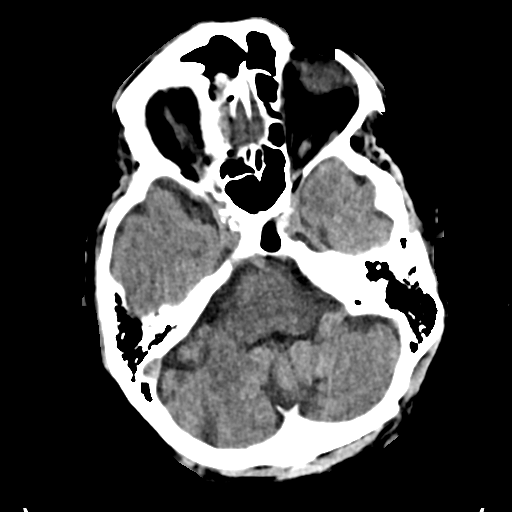
[im 15/34  brain]
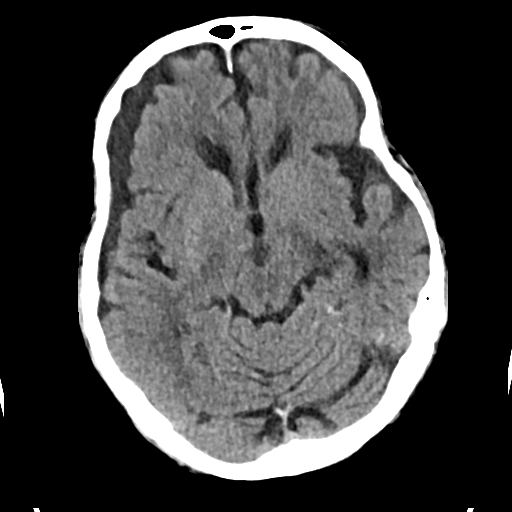
[im 19/34  brain]
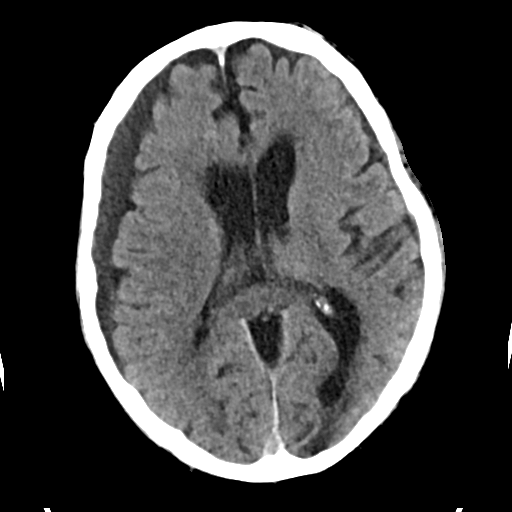
[im 24/34  brain]
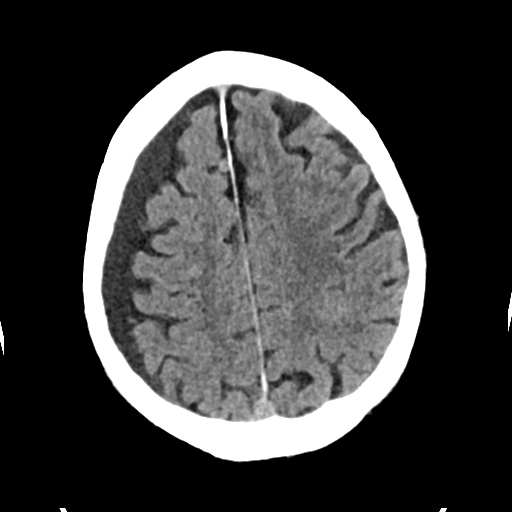
[im 24/34  bone]
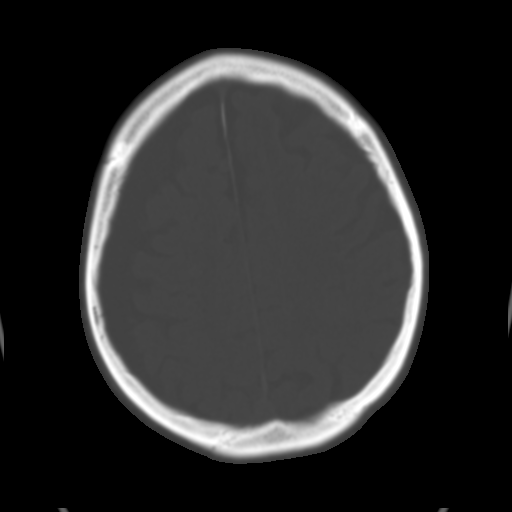
[im 29/34  brain]
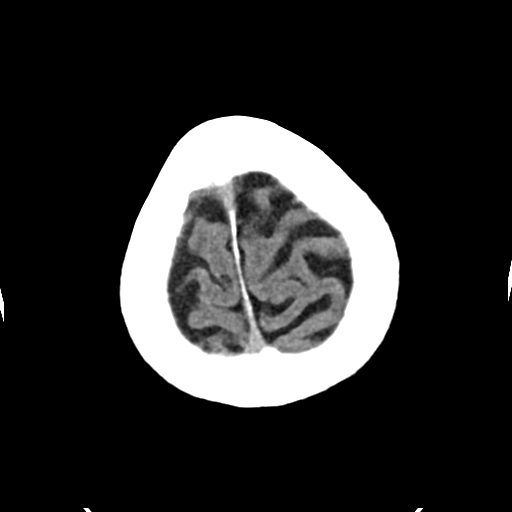

[Series 4: head bone · axial · 0.41mm/px · z∈[+1166,+1248]mm · 5 of 87 slices shown]
[im 9/87  bone]
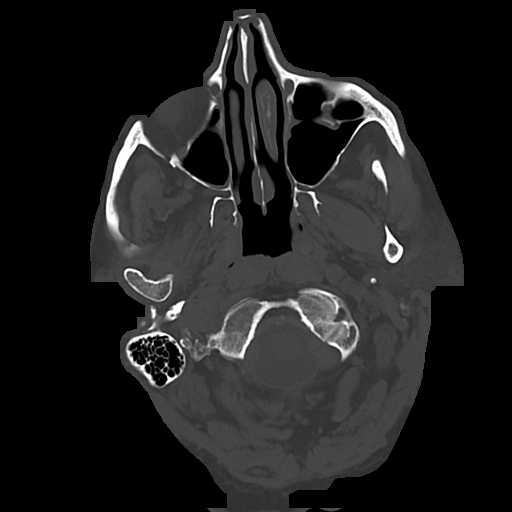
[im 17/87  bone]
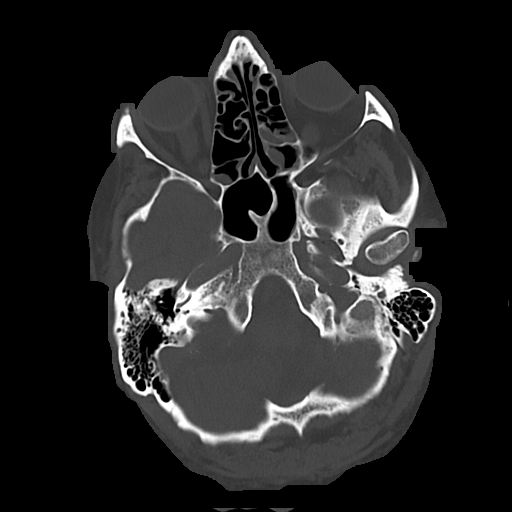
[im 29/87  bone]
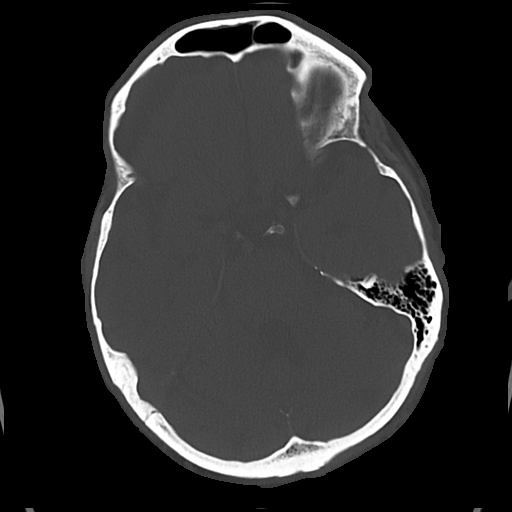
[im 37/87  bone]
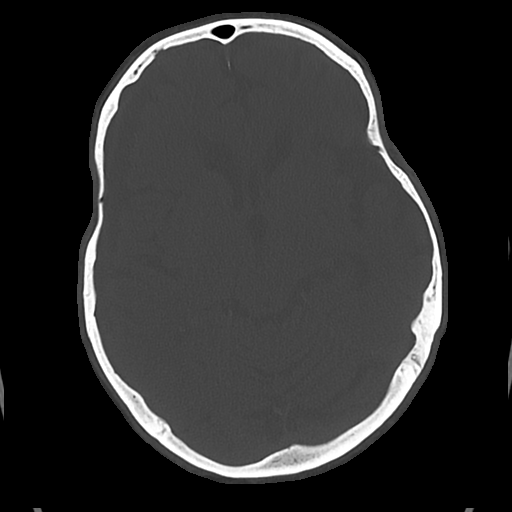
[im 50/87  bone]
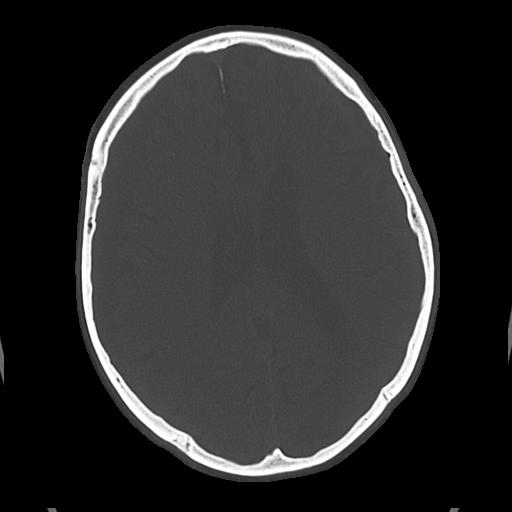

[Series 5: cor soft · coronal · 0.33mm/px · 3 of 72 slices shown]
[im 24/72  brain]
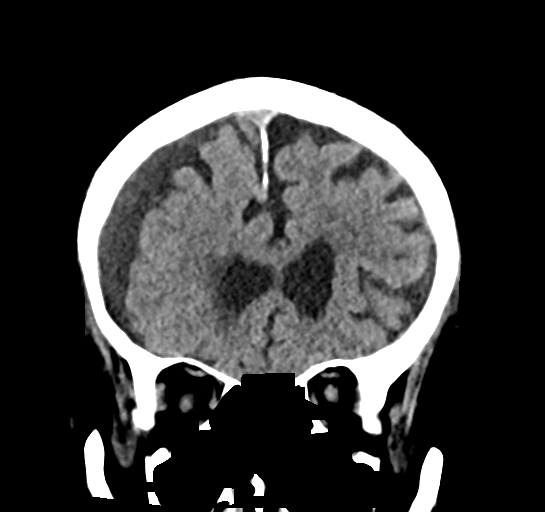
[im 32/72  brain]
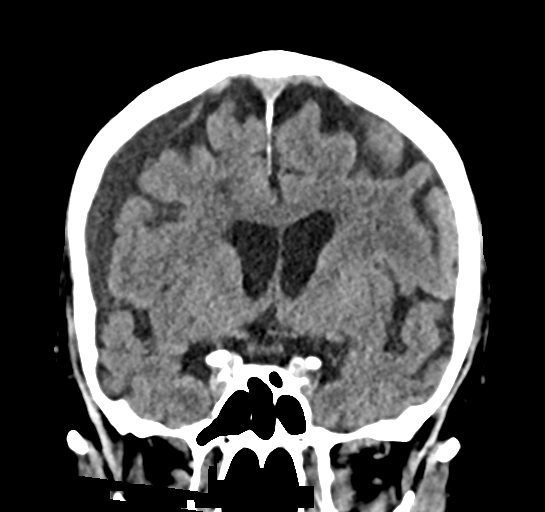
[im 40/72  brain]
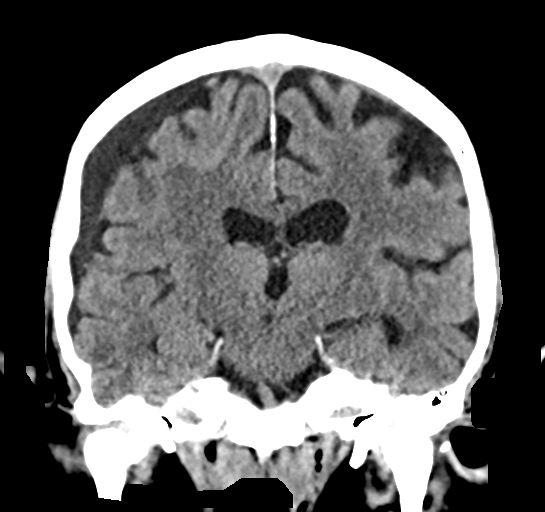

[Series 6: sag soft · sagittal · 0.33mm/px · 3 of 61 slices shown]
[im 22/61  brain]
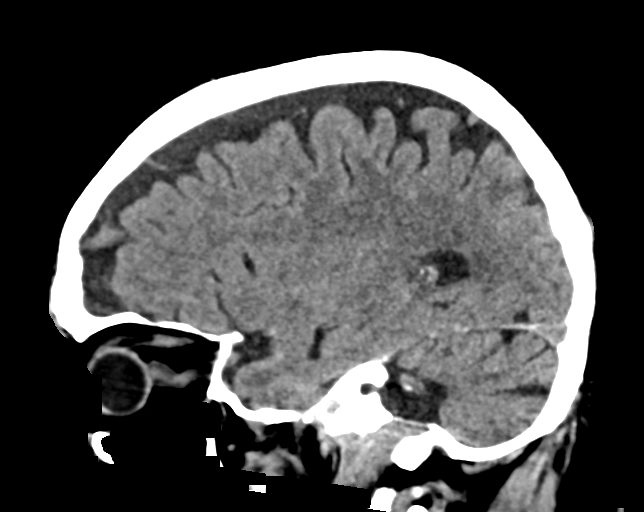
[im 31/61  brain]
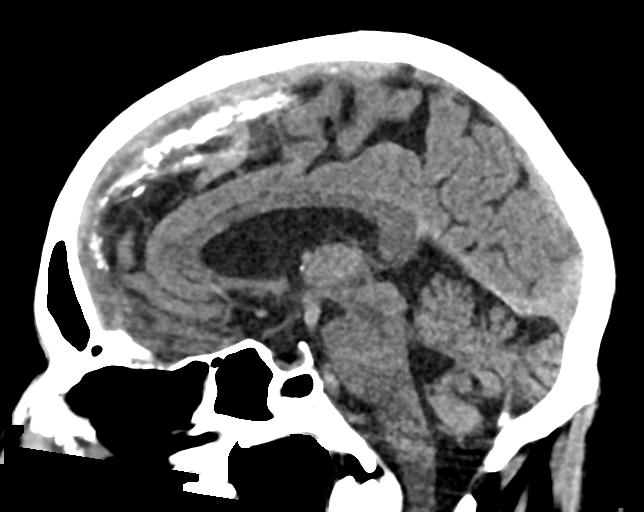
[im 39/61  brain]
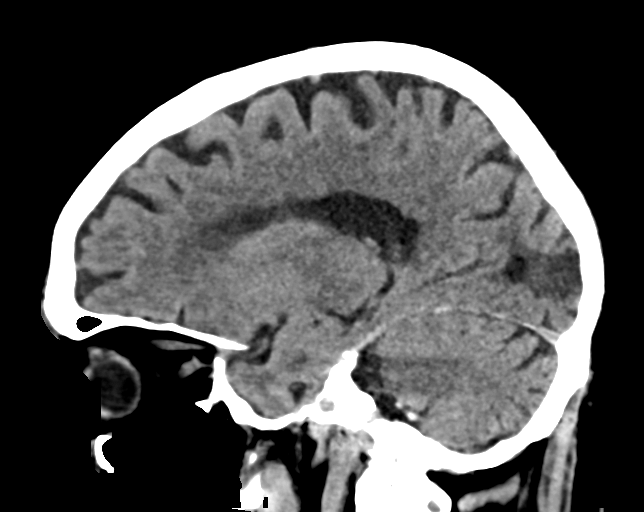

[17 of 47 positions shown; findings below may reference images not displayed]

FINDINGS: Brain: There is a low-density subdural fluid collections seen
overlying the right frontal and parietal lobes measuring up to 13 mm
in thickness compatible with chronic subdural hematoma. 5 mm of
right to left midline shift. No hydrocephalus. No hemorrhage or
acute infarction. Old left occipital infarct.

Vascular: No hyperdense vessel or unexpected calcification.

Skull: No acute calvarial abnormality.

Sinuses/Orbits: No acute findings

Other: None
IMPRESSION: Chronic right subdural hematoma measuring up to 13 mm overlying the
right frontal lobe. 5 mm of right to left midline shift.

Old left occipital infarct.
